# Patient Record
Sex: Female | Born: 1935 | Race: White | Hispanic: No | Smoking: Former smoker
Health system: Southern US, Community
[De-identification: ages and names within clinical notes are randomized; demographics above are authoritative.]

## PROBLEM LIST (undated history)

## (undated) DIAGNOSIS — I10 Essential (primary) hypertension: Secondary | ICD-10-CM

## (undated) DIAGNOSIS — H3530 Unspecified macular degeneration: Secondary | ICD-10-CM

## (undated) DIAGNOSIS — D649 Anemia, unspecified: Secondary | ICD-10-CM

## (undated) DIAGNOSIS — M1990 Unspecified osteoarthritis, unspecified site: Secondary | ICD-10-CM

## (undated) DIAGNOSIS — I509 Heart failure, unspecified: Secondary | ICD-10-CM

## (undated) HISTORY — DX: Anemia, unspecified: D64.9

## (undated) HISTORY — PX: TRIGGER FINGER RELEASE: SHX641

## (undated) HISTORY — PX: ABDOMINAL SURGERY: SHX537

## (undated) HISTORY — DX: Essential (primary) hypertension: I10

## (undated) HISTORY — PX: ABDOMINAL HYSTERECTOMY: SHX81

## (undated) HISTORY — PX: BLADDER SURGERY: SHX569

## (undated) HISTORY — PX: JOINT REPLACEMENT: SHX530

## (undated) HISTORY — PX: CARPAL TUNNEL RELEASE: SHX101

## (undated) HISTORY — PX: COLON SURGERY: SHX602

## (undated) HISTORY — DX: Heart failure, unspecified: I50.9

## (undated) HISTORY — PX: FRACTURE SURGERY: SHX138

---

## 2006-05-20 ENCOUNTER — Ambulatory Visit: Payer: Self-pay | Admitting: General Practice

## 2006-05-20 ENCOUNTER — Other Ambulatory Visit: Payer: Self-pay

## 2006-05-21 ENCOUNTER — Encounter: Payer: Self-pay | Admitting: Specialist

## 2007-01-21 ENCOUNTER — Ambulatory Visit: Payer: Self-pay | Admitting: Specialist

## 2008-01-22 ENCOUNTER — Inpatient Hospital Stay: Payer: Self-pay | Admitting: Orthopaedic Surgery

## 2008-03-02 ENCOUNTER — Other Ambulatory Visit: Payer: Self-pay

## 2008-03-02 ENCOUNTER — Ambulatory Visit: Payer: Self-pay | Admitting: General Practice

## 2008-03-02 ENCOUNTER — Ambulatory Visit: Payer: Self-pay | Admitting: Internal Medicine

## 2008-03-31 ENCOUNTER — Ambulatory Visit: Payer: Self-pay | Admitting: General Practice

## 2008-04-13 ENCOUNTER — Inpatient Hospital Stay: Payer: Self-pay | Admitting: General Practice

## 2008-04-17 ENCOUNTER — Encounter: Payer: Self-pay | Admitting: Internal Medicine

## 2008-07-06 ENCOUNTER — Ambulatory Visit: Payer: Self-pay | Admitting: General Practice

## 2008-07-15 ENCOUNTER — Ambulatory Visit: Payer: Self-pay | Admitting: General Practice

## 2008-07-21 ENCOUNTER — Inpatient Hospital Stay: Payer: Self-pay | Admitting: General Practice

## 2008-07-25 ENCOUNTER — Encounter: Payer: Self-pay | Admitting: Internal Medicine

## 2008-11-03 ENCOUNTER — Ambulatory Visit: Payer: Self-pay | Admitting: General Practice

## 2008-11-17 ENCOUNTER — Inpatient Hospital Stay: Payer: Self-pay | Admitting: General Practice

## 2008-11-21 ENCOUNTER — Encounter: Payer: Self-pay | Admitting: Internal Medicine

## 2010-05-09 ENCOUNTER — Inpatient Hospital Stay: Payer: Self-pay | Admitting: Internal Medicine

## 2011-06-02 ENCOUNTER — Ambulatory Visit (INDEPENDENT_AMBULATORY_CARE_PROVIDER_SITE_OTHER): Payer: Medicare Other | Admitting: Internal Medicine

## 2011-06-02 ENCOUNTER — Encounter: Payer: Self-pay | Admitting: Internal Medicine

## 2011-06-02 DIAGNOSIS — R5383 Other fatigue: Secondary | ICD-10-CM

## 2011-06-02 DIAGNOSIS — E538 Deficiency of other specified B group vitamins: Secondary | ICD-10-CM

## 2011-06-02 DIAGNOSIS — D649 Anemia, unspecified: Secondary | ICD-10-CM

## 2011-06-02 LAB — COMPREHENSIVE METABOLIC PANEL
BUN: 14 mg/dL (ref 6–23)
CO2: 24 mEq/L (ref 19–32)
Creatinine, Ser: 0.8 mg/dL (ref 0.4–1.2)
GFR: 74.06 mL/min (ref >60.00)
Glucose, Bld: 110 mg/dL — ABNORMAL HIGH (ref 70–99)
Total Bilirubin: 0.6 mg/dL (ref 0.3–1.2)
Total Protein: 7.4 g/dL (ref 6.0–8.3)

## 2011-06-02 LAB — VITAMIN B12: Vitamin B-12: 340 pg/mL (ref 211–911)

## 2011-06-02 LAB — CBC WITH DIFFERENTIAL/PLATELET
Basophils Absolute: 0 10*3/uL (ref 0.0–0.1)
Eosinophils Absolute: 0.1 10*3/uL (ref 0.0–0.7)
Lymphocytes Relative: 32 % (ref 12.0–46.0)
MCHC: 33.3 g/dL (ref 30.0–36.0)
MCV: 94.1 fl (ref 78.0–100.0)
Monocytes Absolute: 0.7 10*3/uL (ref 0.1–1.0)
Neutrophils Relative %: 55.2 % (ref 43.0–77.0)
Platelets: 224 10*3/uL (ref 150.0–400.0)
WBC: 6.5 10*3/uL (ref 4.5–10.5)

## 2011-06-02 LAB — FERRITIN: Ferritin: 32.5 ng/mL (ref 10.0–291.0)

## 2011-06-02 LAB — IRON AND TIBC
Iron: 94 ug/dL (ref 42–145)
UIBC: 276 ug/dL (ref 125–400)

## 2011-06-02 NOTE — Progress Notes (Signed)
Subjective:    Patient ID: Rebekah Ball, female    DOB: 09/04/1920, 77 y.o.   MRN: [id number]  HPI      Review of Systems     Objective:   Physical Exam        Assessment & Plan:   Subjective:    Rebekah Ball is a 77 y.o. female who presents for Medicare Annual/Subsequent preventive examination.  Preventive Screening-Counseling & Management  Tobacco History  Smoking status  . Former Smoker  Smokeless tobacco  . Not on file     Problems Prior to Visit 1.   Current Problems (verified) Patient Active Problem List  Diagnoses  . Anemia    Medications Prior to Visit No current outpatient prescriptions on file prior to visit.    Current Medications (verified) Current Outpatient Prescriptions  Medication Sig Dispense Refill  . aspirin 81 MG tablet Take 81 mg by mouth daily.        . fish oil-omega-3 fatty acids 1000 MG capsule Take 2 g by mouth daily.        Marland Kitchen glucosamine-chondroitin 500-400 MG tablet Take 1 tablet by mouth daily.        . naproxen sodium (ANAPROX) 220 MG tablet Take 220 mg by mouth daily.           Allergies (verified) Morphine and related   PAST HISTORY  Family History No family history on file.  Social History History  Substance Use Topics  . Smoking status: Former Games developer  . Smokeless tobacco: Not on file  . Alcohol Use: Yes     glass of wine q 3-4 months     Are there smokers in your home (other than you)? No  Risk Factors Current exercise habits: Home exercise routine includes stretching, walking 1 hrs per day and yoga.  Dietary issues discussed: none  Cardiac risk factors: advanced age (older than 42 for men, 69 for women).  Depression Screen (Note: if answer to either of the following is "Yes", a more complete depression screening is indicated)   Over the past two weeks, have you felt down, depressed or hopeless? No  Over the past two weeks, have you felt little interest or pleasure in doing things? No  Have you lost  interest or pleasure in daily life? No  Do you often feel hopeless? No  Do you cry easily over simple problems? No  Activities of Daily Living In your present state of health, do you have any difficulty performing the following activities?:  Driving? No Managing money?  No Feeding yourself? No Getting from bed to chair? No Climbing a flight of stairs? No Preparing food and eating?: No Bathing or showering? No Getting dressed: No Getting to the toilet? No Using the toilet:No Moving around from place to place: No In the past year have you fallen or had a near fall?:No   Are you sexually active?  No  Do you have more than one partner?  No  Hearing Difficulties: No Do you often ask people to speak up or repeat themselves? No Do you experience ringing or noises in your ears? No Do you have difficulty understanding soft or whispered voices? Yes   Do you feel that you have a problem with memory? No  Do you often misplace items? No  Do you feel safe at home?  Yes  Cognitive Testing  Alert? Yes  Normal Appearance?Yes  Oriented to person? Yes  Place? Yes   Time? No  Recall of  three objects?  Yes  Can perform simple calculations? Yes  Displays appropriate judgment?Yes  Can read the correct time from a watch face?Yes   Advanced Directives have been discussed with the patient? Yes  List the Names of Other Physician/Practitioners you currently use: 1.    Indicate any recent Medical Services you may have received from other than [facility] providers in the past year (date may be approximate).   There is no immunization history on file for this patient.  Screening Tests No health maintenance topics applied.  All answers were reviewed with the patient and necessary referrals were made:  Duncan Dull, MD   06/04/2011   History reviewed: allergies, current medications, past family history, past medical history, past social history, past surgical history and problem list  Review of  Systems A comprehensive review of systems was negative.    Objective:     Vision by Snellen chart: right ZOX:WRUEAVW declines measurement, left UJW:JXBJYNW declines measurement  Body mass index is 29.76 kg/(m^2). BP 142/80  Temp(Src) 97.8 F (36.6 C) (Oral)  Ht 5\' 3"  (1.6 m)  Wt 168 lb (76.204 kg)  BMI 29.76 kg/m2  BP 142/80  Temp(Src) 97.8 F (36.6 C) (Oral)  Ht 5\' 3"  (1.6 m)  Wt 168 lb (76.204 kg)  BMI 29.76 kg/m2 General appearance: alert, cooperative, appears stated age and moderately obese Eyes: conjunctivae/corneas clear. PERRL, EOM's intact. Fundi benign. Throat: lips, mucosa, and tongue normal; teeth and gums normal Neck: no adenopathy, no carotid bruit, no JVD, supple, symmetrical, trachea midline and thyroid not enlarged, symmetric, no tenderness/mass/nodules Lungs: clear to auscultation bilaterally Breasts: normal appearance, no masses or tenderness Abdomen: soft, non-tender; bowel sounds normal; no masses,  no organomegaly Extremities: extremities normal, atraumatic, no cyanosis or edema Pulses: 2+ and symmetric Lymph nodes: Cervical, supraclavicular, and axillary nodes normal. Neurologic: Grossly normal     Assessment:     Annual exam:  She is a healthy 77 yo female with no new issues identified  today.     Plan:     During the course of the visit the patient was educated and counseled about appropriate screening and preventive services including:    Advanced directives: has NO advanced directive  - add't info requested. Referral to SW: yes  Diet review for nutrition referral? Yes ____  Not Indicated ____   Patient Instructions (the written plan) was given to the patient.  Medicare Attestation I have personally reviewed: The patient's medical and social history Their use of alcohol, tobacco or illicit drugs Their current medications and supplements The patient's functional ability including ADLs,fall risks, home safety risks, cognitive, and  hearing and visual impairment Diet and physical activities Evidence for depression or mood disorders  The patient's weight, height, BMI, and visual acuity have been recorded in the chart.  I have made referrals, counseling, and provided education to the patient based on review of the above and I have provided the patient with a written personalized care plan for preventive services.     Duncan Dull, MD   06/04/2011

## 2011-06-02 NOTE — Patient Instructions (Signed)
  We are checking your thyroid, iron and b12 stores today to investigate your anemia.   Please get your TdaP (tetanus/ diphteria.whooping cough)  vaccine at [facility]

## 2011-06-04 ENCOUNTER — Encounter: Payer: Self-pay | Admitting: Internal Medicine

## 2011-06-04 DIAGNOSIS — D649 Anemia, unspecified: Secondary | ICD-10-CM | POA: Insufficient documentation

## 2011-09-07 DIAGNOSIS — M169 Osteoarthritis of hip, unspecified: Secondary | ICD-10-CM | POA: Diagnosis not present

## 2011-09-11 DIAGNOSIS — Z85828 Personal history of other malignant neoplasm of skin: Secondary | ICD-10-CM | POA: Diagnosis not present

## 2011-09-26 ENCOUNTER — Ambulatory Visit (INDEPENDENT_AMBULATORY_CARE_PROVIDER_SITE_OTHER): Payer: Medicare Other | Admitting: Internal Medicine

## 2011-09-26 ENCOUNTER — Encounter: Payer: Self-pay | Admitting: Internal Medicine

## 2011-09-26 DIAGNOSIS — M1990 Unspecified osteoarthritis, unspecified site: Secondary | ICD-10-CM | POA: Diagnosis not present

## 2011-09-26 DIAGNOSIS — I10 Essential (primary) hypertension: Secondary | ICD-10-CM

## 2011-09-26 MED ORDER — AMLODIPINE BESYLATE 5 MG PO TABS: 5.0000 mg | ORAL_TABLET | Freq: Every day | ORAL | Status: DC

## 2011-09-26 MED ORDER — TRAMADOL HCL 50 MG PO TABS: 50.0000 mg | ORAL_TABLET | Freq: Three times a day (TID) | ORAL | Status: AC | PRN

## 2011-09-26 NOTE — Progress Notes (Signed)
Subjective:    Rebekah Ball is a 78 y.o. female who presents to the office today for a preoperative consultation at the request of Dr Ernest Pine  who plans on performing total left hip replacement on  March 12. This consultation is requested for the specific conditions prompting preoperative evaluation (i.e. because of potential affect on operative risk): Marland Kitchen Planned anesthesia: general. The patient has the following known anesthesia issues: none. Patients bleeding risk: no recent abnormal bleeding. Patient does not have objections to receiving blood products if needed.  The following portions of the patient's history were reviewed and updated as appropriate: allergies, current medications, past family history, past medical history, past surgical history and problem list.  Review of Systems A comprehensive review of systems was negative.    Objective:    BP 150/72  Pulse 76  Temp(Src) 97.4 F (36.3 C) (Oral)  Wt 162 lb (73.483 kg)  SpO2 98%  General Appearance:    Alert, cooperative, no distress, appears stated age  Head:    Normocephalic, without obvious abnormality, atraumatic  Eyes:    PERRL, conjunctiva/corneas clear, EOM's intact, fundi    benign, both eyes  Ears:    Normal TM's and external ear canals, both ears  Nose:   Nares normal, septum midline, mucosa normal, no drainage    or sinus tenderness  Throat:   Lips, mucosa, and tongue normal; teeth and gums normal  Neck:   Supple, symmetrical, trachea midline, no adenopathy;    thyroid:  no enlargement/tenderness/nodules; no carotid   bruit or JVD  Back:     Symmetric, no curvature, ROM normal, no CVA tenderness  Lungs:     Clear to auscultation bilaterally, respirations unlabored  Chest Wall:    No tenderness or deformity   Heart:    Regular rate and rhythm, S1 and S2 normal, no murmur, rub   or gallop  Breast Exam:    No tenderness, masses, or nipple abnormality  Abdomen:     Soft, non-tender, bowel sounds active all four  quadrants,    no masses, no organomegaly  Genitalia:    Normal female without lesion, discharge or tenderness  Rectal:    Normal tone, normal prostate, no masses or tenderness;   guaiac negative stool  Extremities:   Extremities normal, atraumatic, no cyanosis or edema  Pulses:   2+ and symmetric all extremities  Skin:   Skin color, texture, turgor normal, no rashes or lesions  Lymph nodes:   Cervical, supraclavicular, and axillary nodes normal  Neurologic:   CNII-XII intact, normal strength, sensation and reflexes    throughout    Predictors of intubation difficulty:  Morbid obesity? no  Anatomically abnormal facies? no  Prominent incisors? no  Receding mandible? no  Short, thick neck? no  Neck range of motion: normal  No chipped, loose, or missing teeth.  Cardiographics ECG: normal sinus rhythm, no blocks or conduction defects, no ischemic changes Echocardiogram: not done  Imaging Chest x-ray: to be ordered by [facility]   Lab Review  No visits with results within 2 Month(s) from this visit. Latest known visit with results is:  Office Visit on 06/02/2011  Component Date Value  . TSH 06/02/2011 2.69   . WBC 06/02/2011 6.5   . RBC 06/02/2011 4.56   . Hemoglobin 06/02/2011 14.3   . HCT 06/02/2011 42.9   . MCV 06/02/2011 94.1   . MCHC 06/02/2011 33.3   . RDW 06/02/2011 13.8   . Platelets 06/02/2011 224.0   .  Neutrophils Relative 06/02/2011 55.2   . Lymphocytes Relative 06/02/2011 32.0   . Monocytes Relative 06/02/2011 10.4   . Eosinophils Relative 06/02/2011 1.8   . Basophils Relative 06/02/2011 0.6   . Neutro Abs 06/02/2011 3.6   . Lymphs Abs 06/02/2011 2.1   . Monocytes Absolute 06/02/2011 0.7   . Eosinophils Absolute 06/02/2011 0.1   . Basophils Absolute 06/02/2011 0.0   . Ferritin 06/02/2011 32.5   . Iron 06/02/2011 94   . UIBC 06/02/2011 276   . TIBC 06/02/2011 370   . %SAT 06/02/2011 25   . Sodium 06/02/2011 137   . Potassium 06/02/2011 4.7   . Chloride  06/02/2011 104   . CO2 06/02/2011 24   . Glucose, Bld 06/02/2011 110*  . BUN 06/02/2011 14   . Creatinine, Ser 06/02/2011 0.8   . Total Bilirubin 06/02/2011 0.6   . Alkaline Phosphatase 06/02/2011 60   . AST 06/02/2011 18   . ALT 06/02/2011 11   . Total Protein 06/02/2011 7.4   . Albumin 06/02/2011 4.1   . Calcium 06/02/2011 9.0   . GFR 06/02/2011 74.06   . Vitamin B-12 06/02/2011 340       Assessment:      78 y.o. female with planned surgery as above.   Known risk factors for perioperative complications: None   Difficulty with intubation is not anticipated. Hypertension: new onset.  Cardiac Risk Estimation: low  Current medications which may produce withdrawal symptoms if withheld perioperatively: none    Plan:    1. Preoperative workup as follows chest x-ray. 2. Change in medication regimen before surgery: discontinue ASA 14 days before surgery and discontinue NSAIDs () 14 days before surgery. 3. Prophylaxis for cardiac events with perioperative beta-blockers: not indicated. 4. Invasive hemodynamic monitoring perioperatively: not indicated. 5. Deep vein thrombosis prophylaxis postoperatively:regimen to be chosen by surgical team. 6. Surveillance for postoperative MI with ECG immediately postoperatively and on postoperative days 1 and 2 AND troponin levels 24 hours postoperatively and on day 4 or hospital discharge (whichever comes first): not indicated. 7. Other measures: amlodipine 5 m,g daily, started today for hypertension.

## 2011-09-26 NOTE — Patient Instructions (Addendum)
I am starting you on a medication called amlodipine for your blood pressure .  Take it one time daily every day.  Have the nurse repeat your blood pressure in one week.  I am also giving you a new pain medication to replace your daily  Alleve.  It is called tramadol and can be taken every 6 to 8 hours as needed for pain   I recommend a trial of lactaid instead of regular milk to see if your flatulence and loose stools is from lactose intolerance

## 2011-09-28 ENCOUNTER — Encounter: Payer: Self-pay | Admitting: Internal Medicine

## 2011-09-28 DIAGNOSIS — I10 Essential (primary) hypertension: Secondary | ICD-10-CM | POA: Insufficient documentation

## 2011-09-28 NOTE — Assessment & Plan Note (Signed)
New onset, may be due to naproxen  Amlodipine 5 mg daily started.

## 2011-10-05 ENCOUNTER — Telehealth: Payer: Self-pay | Admitting: Internal Medicine

## 2011-10-05 ENCOUNTER — Other Ambulatory Visit: Payer: Self-pay | Admitting: Internal Medicine

## 2011-10-05 DIAGNOSIS — I10 Essential (primary) hypertension: Secondary | ICD-10-CM

## 2011-10-05 MED ORDER — AMLODIPINE BESYLATE 10 MG PO TABS: 10.0000 mg | ORAL_TABLET | Freq: Every day | ORAL | Status: DC

## 2011-10-05 NOTE — Telephone Encounter (Signed)
please tell her to increase the amlodipine to 10 mg daily for her blood pressure  Once daily .  iw ill sen dnew rx to pharmacy

## 2011-10-05 NOTE — Telephone Encounter (Signed)
Of course she has no pharmacy in her chart,  Can you call it in?  thanks

## 2011-10-06 NOTE — Telephone Encounter (Signed)
Medicine called to [facility]. 

## 2011-10-12 ENCOUNTER — Ambulatory Visit: Payer: Self-pay | Admitting: General Practice

## 2011-10-12 DIAGNOSIS — Z8719 Personal history of other diseases of the digestive system: Secondary | ICD-10-CM | POA: Diagnosis not present

## 2011-10-12 DIAGNOSIS — Z79899 Other long term (current) drug therapy: Secondary | ICD-10-CM | POA: Diagnosis not present

## 2011-10-12 DIAGNOSIS — M1990 Unspecified osteoarthritis, unspecified site: Secondary | ICD-10-CM | POA: Diagnosis not present

## 2011-10-12 DIAGNOSIS — Z01812 Encounter for preprocedural laboratory examination: Secondary | ICD-10-CM | POA: Diagnosis not present

## 2011-10-12 DIAGNOSIS — Z87891 Personal history of nicotine dependence: Secondary | ICD-10-CM | POA: Diagnosis not present

## 2011-10-12 DIAGNOSIS — M169 Osteoarthritis of hip, unspecified: Secondary | ICD-10-CM | POA: Diagnosis not present

## 2011-10-12 DIAGNOSIS — Z885 Allergy status to narcotic agent status: Secondary | ICD-10-CM | POA: Diagnosis not present

## 2011-10-12 DIAGNOSIS — Z9109 Other allergy status, other than to drugs and biological substances: Secondary | ICD-10-CM | POA: Diagnosis not present

## 2011-10-12 LAB — BASIC METABOLIC PANEL
BUN: 15 mg/dL (ref 7–18)
Calcium, Total: 9.2 mg/dL (ref 8.5–10.1)
Co2: 25 mmol/L (ref 21–32)
Creatinine: 0.82 mg/dL (ref 0.60–1.30)
EGFR (African American): >60
EGFR (Non-African Amer.): >60
Glucose: 74 mg/dL (ref 65–99)
Potassium: 4.2 mmol/L (ref 3.5–5.1)
Sodium: 140 mmol/L (ref 136–145)

## 2011-10-12 LAB — CBC
MCH: 30.9 pg (ref 26.0–34.0)
MCV: 94 fL (ref 80–100)
Platelet: 250 10*3/uL (ref 150–440)
RBC: 4.5 10*6/uL (ref 3.80–5.20)
RDW: 13.6 % (ref 11.5–14.5)

## 2011-10-12 LAB — URINALYSIS, COMPLETE
Bacteria: NONE SEEN
Bilirubin,UR: NEGATIVE
Blood: NEGATIVE
Ketone: NEGATIVE
Ph: 5 (ref 4.5–8.0)
Protein: NEGATIVE
Specific Gravity: 1.013 (ref 1.003–1.030)
Squamous Epithelial: 1

## 2011-10-12 LAB — APTT: Activated PTT: 34.2 secs (ref 23.6–35.9)

## 2011-10-12 LAB — MRSA PCR SCREENING

## 2011-10-12 LAB — SEDIMENTATION RATE: Erythrocyte Sed Rate: 12 mm/hr (ref 0–30)

## 2011-10-12 LAB — PROTIME-INR: Prothrombin Time: 12.1 secs (ref 11.5–14.7)

## 2011-10-25 ENCOUNTER — Inpatient Hospital Stay: Payer: Self-pay | Admitting: General Practice

## 2011-10-25 DIAGNOSIS — Z9109 Other allergy status, other than to drugs and biological substances: Secondary | ICD-10-CM | POA: Diagnosis not present

## 2011-10-25 DIAGNOSIS — Z8719 Personal history of other diseases of the digestive system: Secondary | ICD-10-CM | POA: Diagnosis not present

## 2011-10-25 DIAGNOSIS — K5900 Constipation, unspecified: Secondary | ICD-10-CM | POA: Diagnosis not present

## 2011-10-25 DIAGNOSIS — M6281 Muscle weakness (generalized): Secondary | ICD-10-CM | POA: Diagnosis not present

## 2011-10-25 DIAGNOSIS — Z5189 Encounter for other specified aftercare: Secondary | ICD-10-CM | POA: Diagnosis not present

## 2011-10-25 DIAGNOSIS — Z471 Aftercare following joint replacement surgery: Secondary | ICD-10-CM | POA: Diagnosis not present

## 2011-10-25 DIAGNOSIS — Z96659 Presence of unspecified artificial knee joint: Secondary | ICD-10-CM | POA: Diagnosis not present

## 2011-10-25 DIAGNOSIS — M1990 Unspecified osteoarthritis, unspecified site: Secondary | ICD-10-CM | POA: Diagnosis not present

## 2011-10-25 DIAGNOSIS — M169 Osteoarthritis of hip, unspecified: Secondary | ICD-10-CM | POA: Diagnosis not present

## 2011-10-25 DIAGNOSIS — E785 Hyperlipidemia, unspecified: Secondary | ICD-10-CM | POA: Diagnosis not present

## 2011-10-25 DIAGNOSIS — R6889 Other general symptoms and signs: Secondary | ICD-10-CM | POA: Diagnosis not present

## 2011-10-25 DIAGNOSIS — R269 Unspecified abnormalities of gait and mobility: Secondary | ICD-10-CM | POA: Diagnosis not present

## 2011-10-25 DIAGNOSIS — Z885 Allergy status to narcotic agent status: Secondary | ICD-10-CM | POA: Diagnosis not present

## 2011-10-25 DIAGNOSIS — Z96649 Presence of unspecified artificial hip joint: Secondary | ICD-10-CM | POA: Diagnosis not present

## 2011-10-25 LAB — PLATELET COUNT: Platelet: 200 10*3/uL (ref 150–440)

## 2011-10-25 LAB — CREATININE, SERUM: Creatinine: 0.79 mg/dL (ref 0.60–1.30)

## 2011-10-26 LAB — BASIC METABOLIC PANEL
BUN: 11 mg/dL (ref 7–18)
EGFR (Non-African Amer.): >60
Glucose: 90 mg/dL (ref 65–99)
Potassium: 3.9 mmol/L (ref 3.5–5.1)
Sodium: 138 mmol/L (ref 136–145)

## 2011-10-27 LAB — BASIC METABOLIC PANEL
Anion Gap: 10 (ref 7–16)
Chloride: 106 mmol/L (ref 98–107)
Co2: 23 mmol/L (ref 21–32)
EGFR (African American): >60
EGFR (Non-African Amer.): >60
Glucose: 97 mg/dL (ref 65–99)
Osmolality: 276 (ref 275–301)

## 2011-10-27 LAB — HEMOGLOBIN: HGB: 9.8 g/dL — ABNORMAL LOW (ref 12.0–16.0)

## 2011-10-28 DIAGNOSIS — Z96649 Presence of unspecified artificial hip joint: Secondary | ICD-10-CM | POA: Diagnosis not present

## 2011-10-28 DIAGNOSIS — M6281 Muscle weakness (generalized): Secondary | ICD-10-CM | POA: Diagnosis not present

## 2011-10-28 DIAGNOSIS — R269 Unspecified abnormalities of gait and mobility: Secondary | ICD-10-CM | POA: Diagnosis not present

## 2011-10-28 DIAGNOSIS — R05 Cough: Secondary | ICD-10-CM | POA: Diagnosis not present

## 2011-10-28 DIAGNOSIS — I10 Essential (primary) hypertension: Secondary | ICD-10-CM | POA: Diagnosis not present

## 2011-10-28 DIAGNOSIS — R6889 Other general symptoms and signs: Secondary | ICD-10-CM | POA: Diagnosis not present

## 2011-10-28 DIAGNOSIS — E785 Hyperlipidemia, unspecified: Secondary | ICD-10-CM | POA: Diagnosis not present

## 2011-10-28 DIAGNOSIS — Z5189 Encounter for other specified aftercare: Secondary | ICD-10-CM | POA: Diagnosis not present

## 2011-10-28 DIAGNOSIS — K5900 Constipation, unspecified: Secondary | ICD-10-CM | POA: Diagnosis not present

## 2011-10-28 DIAGNOSIS — Z471 Aftercare following joint replacement surgery: Secondary | ICD-10-CM | POA: Diagnosis not present

## 2011-10-28 DIAGNOSIS — M1991 Primary osteoarthritis, unspecified site: Secondary | ICD-10-CM | POA: Diagnosis not present

## 2011-10-29 ENCOUNTER — Encounter: Payer: Self-pay | Admitting: Internal Medicine

## 2011-10-30 DIAGNOSIS — M1991 Primary osteoarthritis, unspecified site: Secondary | ICD-10-CM | POA: Diagnosis not present

## 2011-10-30 DIAGNOSIS — I10 Essential (primary) hypertension: Secondary | ICD-10-CM | POA: Diagnosis not present

## 2011-11-08 DIAGNOSIS — R05 Cough: Secondary | ICD-10-CM | POA: Diagnosis not present

## 2011-11-13 ENCOUNTER — Encounter: Payer: Self-pay | Admitting: Internal Medicine

## 2011-11-20 DIAGNOSIS — Z96659 Presence of unspecified artificial knee joint: Secondary | ICD-10-CM | POA: Diagnosis not present

## 2011-11-20 DIAGNOSIS — M6281 Muscle weakness (generalized): Secondary | ICD-10-CM | POA: Diagnosis not present

## 2011-11-27 DIAGNOSIS — Z85828 Personal history of other malignant neoplasm of skin: Secondary | ICD-10-CM | POA: Diagnosis not present

## 2011-12-07 DIAGNOSIS — I10 Essential (primary) hypertension: Secondary | ICD-10-CM | POA: Diagnosis not present

## 2011-12-07 DIAGNOSIS — Z96649 Presence of unspecified artificial hip joint: Secondary | ICD-10-CM | POA: Diagnosis not present

## 2011-12-13 ENCOUNTER — Encounter: Payer: Self-pay | Admitting: Internal Medicine

## 2012-07-29 DIAGNOSIS — C44211 Basal cell carcinoma of skin of unspecified ear and external auricular canal: Secondary | ICD-10-CM | POA: Diagnosis not present

## 2012-07-29 DIAGNOSIS — Z85828 Personal history of other malignant neoplasm of skin: Secondary | ICD-10-CM | POA: Diagnosis not present

## 2012-07-29 DIAGNOSIS — D485 Neoplasm of uncertain behavior of skin: Secondary | ICD-10-CM | POA: Diagnosis not present

## 2012-07-29 DIAGNOSIS — L570 Actinic keratosis: Secondary | ICD-10-CM | POA: Diagnosis not present

## 2012-08-16 DIAGNOSIS — C4441 Basal cell carcinoma of skin of scalp and neck: Secondary | ICD-10-CM | POA: Diagnosis not present

## 2012-11-18 DIAGNOSIS — L820 Inflamed seborrheic keratosis: Secondary | ICD-10-CM | POA: Diagnosis not present

## 2012-11-18 DIAGNOSIS — Z85828 Personal history of other malignant neoplasm of skin: Secondary | ICD-10-CM | POA: Diagnosis not present

## 2012-11-18 DIAGNOSIS — D235 Other benign neoplasm of skin of trunk: Secondary | ICD-10-CM | POA: Diagnosis not present

## 2013-05-14 DIAGNOSIS — Z23 Encounter for immunization: Secondary | ICD-10-CM | POA: Diagnosis not present

## 2013-09-29 DIAGNOSIS — C4441 Basal cell carcinoma of skin of scalp and neck: Secondary | ICD-10-CM | POA: Diagnosis not present

## 2014-01-10 DIAGNOSIS — E860 Dehydration: Secondary | ICD-10-CM | POA: Diagnosis not present

## 2014-01-10 DIAGNOSIS — Z791 Long term (current) use of non-steroidal anti-inflammatories (NSAID): Secondary | ICD-10-CM | POA: Diagnosis not present

## 2014-01-10 DIAGNOSIS — R197 Diarrhea, unspecified: Secondary | ICD-10-CM | POA: Diagnosis not present

## 2014-01-10 DIAGNOSIS — I10 Essential (primary) hypertension: Secondary | ICD-10-CM | POA: Diagnosis not present

## 2014-01-10 DIAGNOSIS — K922 Gastrointestinal hemorrhage, unspecified: Secondary | ICD-10-CM | POA: Diagnosis not present

## 2014-01-10 DIAGNOSIS — K625 Hemorrhage of anus and rectum: Secondary | ICD-10-CM | POA: Diagnosis not present

## 2014-01-10 DIAGNOSIS — N390 Urinary tract infection, site not specified: Secondary | ICD-10-CM | POA: Diagnosis not present

## 2014-01-10 DIAGNOSIS — R110 Nausea: Secondary | ICD-10-CM | POA: Diagnosis not present

## 2014-01-10 DIAGNOSIS — K5730 Diverticulosis of large intestine without perforation or abscess without bleeding: Secondary | ICD-10-CM | POA: Diagnosis not present

## 2014-01-10 DIAGNOSIS — N289 Disorder of kidney and ureter, unspecified: Secondary | ICD-10-CM | POA: Diagnosis not present

## 2014-01-10 DIAGNOSIS — K921 Melena: Secondary | ICD-10-CM | POA: Diagnosis not present

## 2014-01-10 DIAGNOSIS — Z7982 Long term (current) use of aspirin: Secondary | ICD-10-CM | POA: Diagnosis not present

## 2014-01-10 LAB — HEMOGLOBIN: HGB: 11.7 g/dL — AB (ref 12.0–16.0)

## 2014-01-10 LAB — COMPREHENSIVE METABOLIC PANEL
ALBUMIN: 3.3 g/dL — AB (ref 3.4–5.0)
ALT: 12 U/L (ref 12–78)
ANION GAP: 5 — AB (ref 7–16)
Alkaline Phosphatase: 55 U/L
BUN: 21 mg/dL — ABNORMAL HIGH (ref 7–18)
Bilirubin,Total: 0.5 mg/dL (ref 0.2–1.0)
CO2: 23 mmol/L (ref 21–32)
CREATININE: 0.97 mg/dL (ref 0.60–1.30)
Calcium, Total: 8.7 mg/dL (ref 8.5–10.1)
Chloride: 111 mmol/L — ABNORMAL HIGH (ref 98–107)
EGFR (African American): 60 — ABNORMAL LOW
GFR CALC NON AF AMER: 51 — AB
Glucose: 127 mg/dL — ABNORMAL HIGH (ref 65–99)
Osmolality: 282 (ref 275–301)
Potassium: 4.4 mmol/L (ref 3.5–5.1)
SGOT(AST): 27 U/L (ref 15–37)
Sodium: 139 mmol/L (ref 136–145)
TOTAL PROTEIN: 6.9 g/dL (ref 6.4–8.2)

## 2014-01-10 LAB — URINALYSIS, COMPLETE
BILIRUBIN, UR: NEGATIVE
GLUCOSE, UR: NEGATIVE mg/dL (ref 0–75)
Hyaline Cast: 7
Nitrite: NEGATIVE
PH: 5 (ref 4.5–8.0)
Protein: NEGATIVE
RBC,UR: 5 /HPF (ref 0–5)
Specific Gravity: 1.021 (ref 1.003–1.030)
Squamous Epithelial: 3
WBC UR: 2 /HPF (ref 0–5)

## 2014-01-10 LAB — CBC
HCT: 40.8 % (ref 35.0–47.0)
HGB: 13.2 g/dL (ref 12.0–16.0)
MCH: 30.4 pg (ref 26.0–34.0)
MCHC: 32.3 g/dL (ref 32.0–36.0)
MCV: 94 fL (ref 80–100)
PLATELETS: 230 10*3/uL (ref 150–440)
RBC: 4.33 10*6/uL (ref 3.80–5.20)
RDW: 14.1 % (ref 11.5–14.5)
WBC: 8.4 10*3/uL (ref 3.6–11.0)

## 2014-01-10 LAB — PROTIME-INR
INR: 1
Prothrombin Time: 13.1 secs (ref 11.5–14.7)

## 2014-01-10 LAB — OCCULT BLOOD X 1 CARD TO LAB, STOOL: OCCULT BLOOD, FECES: POSITIVE

## 2014-01-11 DIAGNOSIS — E860 Dehydration: Secondary | ICD-10-CM | POA: Diagnosis not present

## 2014-01-11 DIAGNOSIS — Z791 Long term (current) use of non-steroidal anti-inflammatories (NSAID): Secondary | ICD-10-CM | POA: Diagnosis not present

## 2014-01-11 DIAGNOSIS — K922 Gastrointestinal hemorrhage, unspecified: Secondary | ICD-10-CM | POA: Diagnosis not present

## 2014-01-11 DIAGNOSIS — K625 Hemorrhage of anus and rectum: Secondary | ICD-10-CM | POA: Diagnosis not present

## 2014-01-11 DIAGNOSIS — D62 Acute posthemorrhagic anemia: Secondary | ICD-10-CM | POA: Diagnosis not present

## 2014-01-11 DIAGNOSIS — K921 Melena: Secondary | ICD-10-CM | POA: Diagnosis not present

## 2014-01-11 DIAGNOSIS — K5730 Diverticulosis of large intestine without perforation or abscess without bleeding: Secondary | ICD-10-CM | POA: Diagnosis not present

## 2014-01-11 DIAGNOSIS — R110 Nausea: Secondary | ICD-10-CM | POA: Diagnosis not present

## 2014-01-11 DIAGNOSIS — Z7982 Long term (current) use of aspirin: Secondary | ICD-10-CM | POA: Diagnosis not present

## 2014-01-11 DIAGNOSIS — I10 Essential (primary) hypertension: Secondary | ICD-10-CM | POA: Diagnosis not present

## 2014-01-11 LAB — BASIC METABOLIC PANEL
ANION GAP: 5 — AB (ref 7–16)
BUN: 18 mg/dL (ref 7–18)
Calcium, Total: 8 mg/dL — ABNORMAL LOW (ref 8.5–10.1)
Chloride: 114 mmol/L — ABNORMAL HIGH (ref 98–107)
Co2: 24 mmol/L (ref 21–32)
Creatinine: 0.87 mg/dL (ref 0.60–1.30)
EGFR (African American): >60
GFR CALC NON AF AMER: 59 — AB
Glucose: 92 mg/dL (ref 65–99)
Osmolality: 287 (ref 275–301)
Potassium: 3.8 mmol/L (ref 3.5–5.1)
SODIUM: 143 mmol/L (ref 136–145)

## 2014-01-11 LAB — CBC WITH DIFFERENTIAL/PLATELET
BASOS ABS: 0 10*3/uL (ref 0.0–0.1)
BASOS PCT: 0.4 %
EOS ABS: 0.1 10*3/uL (ref 0.0–0.7)
Eosinophil %: 0.9 %
HCT: 31 % — ABNORMAL LOW (ref 35.0–47.0)
HGB: 10.1 g/dL — ABNORMAL LOW (ref 12.0–16.0)
Lymphocyte #: 2 10*3/uL (ref 1.0–3.6)
Lymphocyte %: 26.8 %
MCH: 30.4 pg (ref 26.0–34.0)
MCHC: 32.5 g/dL (ref 32.0–36.0)
MCV: 94 fL (ref 80–100)
Monocyte #: 1 x10 3/mm — ABNORMAL HIGH (ref 0.2–0.9)
Monocyte %: 13.1 %
Neutrophil #: 4.4 10*3/uL (ref 1.4–6.5)
Neutrophil %: 58.8 %
PLATELETS: 194 10*3/uL (ref 150–440)
RBC: 3.32 10*6/uL — ABNORMAL LOW (ref 3.80–5.20)
RDW: 14 % (ref 11.5–14.5)
WBC: 7.5 10*3/uL (ref 3.6–11.0)

## 2014-01-11 LAB — HEMOGLOBIN: HGB: 10.2 g/dL — ABNORMAL LOW (ref 12.0–16.0)

## 2014-01-12 DIAGNOSIS — D62 Acute posthemorrhagic anemia: Secondary | ICD-10-CM | POA: Diagnosis not present

## 2014-01-12 DIAGNOSIS — Z791 Long term (current) use of non-steroidal anti-inflammatories (NSAID): Secondary | ICD-10-CM | POA: Diagnosis not present

## 2014-01-12 DIAGNOSIS — K921 Melena: Secondary | ICD-10-CM | POA: Diagnosis not present

## 2014-01-12 DIAGNOSIS — R110 Nausea: Secondary | ICD-10-CM | POA: Diagnosis not present

## 2014-01-12 DIAGNOSIS — Z7982 Long term (current) use of aspirin: Secondary | ICD-10-CM | POA: Diagnosis not present

## 2014-01-12 DIAGNOSIS — I10 Essential (primary) hypertension: Secondary | ICD-10-CM | POA: Diagnosis not present

## 2014-01-12 DIAGNOSIS — K5730 Diverticulosis of large intestine without perforation or abscess without bleeding: Secondary | ICD-10-CM | POA: Diagnosis not present

## 2014-01-12 DIAGNOSIS — K922 Gastrointestinal hemorrhage, unspecified: Secondary | ICD-10-CM | POA: Diagnosis not present

## 2014-01-12 DIAGNOSIS — E860 Dehydration: Secondary | ICD-10-CM | POA: Diagnosis not present

## 2014-01-12 LAB — HEMOGLOBIN: HGB: 10.2 g/dL — ABNORMAL LOW (ref 12.0–16.0)

## 2014-01-13 ENCOUNTER — Inpatient Hospital Stay: Payer: Self-pay | Admitting: Internal Medicine

## 2014-01-13 DIAGNOSIS — E860 Dehydration: Secondary | ICD-10-CM | POA: Diagnosis present

## 2014-01-13 DIAGNOSIS — R7309 Other abnormal glucose: Secondary | ICD-10-CM | POA: Diagnosis present

## 2014-01-13 DIAGNOSIS — K921 Melena: Secondary | ICD-10-CM | POA: Diagnosis not present

## 2014-01-13 DIAGNOSIS — I10 Essential (primary) hypertension: Secondary | ICD-10-CM | POA: Diagnosis present

## 2014-01-13 DIAGNOSIS — N289 Disorder of kidney and ureter, unspecified: Secondary | ICD-10-CM | POA: Diagnosis present

## 2014-01-13 DIAGNOSIS — K5731 Diverticulosis of large intestine without perforation or abscess with bleeding: Secondary | ICD-10-CM | POA: Diagnosis present

## 2014-01-13 DIAGNOSIS — Z885 Allergy status to narcotic agent status: Secondary | ICD-10-CM | POA: Diagnosis not present

## 2014-01-13 DIAGNOSIS — K5730 Diverticulosis of large intestine without perforation or abscess without bleeding: Secondary | ICD-10-CM | POA: Diagnosis not present

## 2014-01-13 DIAGNOSIS — K922 Gastrointestinal hemorrhage, unspecified: Secondary | ICD-10-CM | POA: Diagnosis not present

## 2014-01-13 DIAGNOSIS — Z87891 Personal history of nicotine dependence: Secondary | ICD-10-CM | POA: Diagnosis not present

## 2014-01-13 DIAGNOSIS — K648 Other hemorrhoids: Secondary | ICD-10-CM | POA: Diagnosis present

## 2014-01-13 DIAGNOSIS — M1990 Unspecified osteoarthritis, unspecified site: Secondary | ICD-10-CM | POA: Diagnosis present

## 2014-01-13 DIAGNOSIS — D62 Acute posthemorrhagic anemia: Secondary | ICD-10-CM | POA: Diagnosis present

## 2014-01-13 DIAGNOSIS — Z7982 Long term (current) use of aspirin: Secondary | ICD-10-CM | POA: Diagnosis not present

## 2014-01-13 DIAGNOSIS — F411 Generalized anxiety disorder: Secondary | ICD-10-CM | POA: Diagnosis present

## 2014-01-13 DIAGNOSIS — H9190 Unspecified hearing loss, unspecified ear: Secondary | ICD-10-CM | POA: Diagnosis present

## 2014-01-13 DIAGNOSIS — Z791 Long term (current) use of non-steroidal anti-inflammatories (NSAID): Secondary | ICD-10-CM | POA: Diagnosis not present

## 2014-01-13 LAB — HEMOGLOBIN: HGB: 10.2 g/dL — ABNORMAL LOW (ref 12.0–16.0)

## 2014-01-14 ENCOUNTER — Telehealth: Payer: Self-pay | Admitting: Internal Medicine

## 2014-01-14 DIAGNOSIS — I10 Essential (primary) hypertension: Secondary | ICD-10-CM | POA: Diagnosis not present

## 2014-01-14 DIAGNOSIS — K922 Gastrointestinal hemorrhage, unspecified: Secondary | ICD-10-CM | POA: Diagnosis not present

## 2014-01-14 DIAGNOSIS — D62 Acute posthemorrhagic anemia: Secondary | ICD-10-CM | POA: Diagnosis not present

## 2014-01-14 DIAGNOSIS — E860 Dehydration: Secondary | ICD-10-CM | POA: Diagnosis not present

## 2014-01-14 LAB — HEMOGLOBIN: HGB: 10.2 g/dL — ABNORMAL LOW (ref 12.0–16.0)

## 2014-01-14 NOTE — Telephone Encounter (Signed)
12:00 ON Monday,.  I cannot clarify the medications without the hospital discharge summary

## 2014-01-14 NOTE — Telephone Encounter (Signed)
Patient just came home , and needs clarification on medications Patient care for [facility] sending over medication list, also patient needs follow up but no appointment available until 01/20/14 and only an acute spot open. Patient hospitalized for GI bleed and Hypertension.  Marfa would like patient seen within 3 days of discharge and patient prefers to see only you.  For my information call Ival Bible @ [phone] or Augustine Radar @ [phone]

## 2014-01-14 NOTE — Telephone Encounter (Signed)
Shelia from [facility] called states patient has been in the hospital since January 13, 2014 and is being released today. She stated patient needed to be seen within 2-3 days. Please advise where you would like me to put patient on the schedule. They said she was admitted for Hypertension and GI bleed we need to call patient with the appointment.

## 2014-01-15 ENCOUNTER — Telehealth: Payer: Self-pay | Admitting: Internal Medicine

## 2014-01-15 NOTE — Telephone Encounter (Signed)
Needing a hospital follow appointment with in 2-3 days. The patient was seen at [facility] for  GI bleed.

## 2014-01-15 NOTE — Telephone Encounter (Signed)
I tried to schedule this time I cannot blocked. Patient is aware.

## 2014-01-15 NOTE — Telephone Encounter (Signed)
Scheduled for Monday at 12.

## 2014-01-16 NOTE — Telephone Encounter (Signed)
done

## 2014-01-19 ENCOUNTER — Encounter: Payer: Self-pay | Admitting: Internal Medicine

## 2014-01-19 ENCOUNTER — Ambulatory Visit (INDEPENDENT_AMBULATORY_CARE_PROVIDER_SITE_OTHER): Payer: Medicare Other | Admitting: Internal Medicine

## 2014-01-19 VITALS — BP 148/66 | HR 70 | Temp 97.7°F | Resp 14 | Ht 63.0 in | Wt 158.2 lb

## 2014-01-19 DIAGNOSIS — D62 Acute posthemorrhagic anemia: Secondary | ICD-10-CM | POA: Diagnosis not present

## 2014-01-19 DIAGNOSIS — D649 Anemia, unspecified: Secondary | ICD-10-CM | POA: Diagnosis not present

## 2014-01-19 DIAGNOSIS — I10 Essential (primary) hypertension: Secondary | ICD-10-CM | POA: Diagnosis not present

## 2014-01-19 LAB — CBC WITH DIFFERENTIAL/PLATELET
Basophils Absolute: 0 10*3/uL (ref 0.0–0.1)
Basophils Relative: 0.6 % (ref 0.0–3.0)
EOS ABS: 0.1 10*3/uL (ref 0.0–0.7)
Eosinophils Relative: 1.2 % (ref 0.0–5.0)
HCT: 30.5 % — ABNORMAL LOW (ref 36.0–46.0)
HEMOGLOBIN: 10 g/dL — AB (ref 12.0–15.0)
LYMPHS ABS: 2.3 10*3/uL (ref 0.7–4.0)
Lymphocytes Relative: 29.1 % (ref 12.0–46.0)
MCHC: 32.8 g/dL (ref 30.0–36.0)
MCV: 93.9 fl (ref 78.0–100.0)
Monocytes Absolute: 0.9 10*3/uL (ref 0.1–1.0)
Monocytes Relative: 10.9 % (ref 3.0–12.0)
NEUTROS ABS: 4.6 10*3/uL (ref 1.4–7.7)
Neutrophils Relative %: 58.2 % (ref 43.0–77.0)
Platelets: 268 10*3/uL (ref 150.0–400.0)
RBC: 3.25 Mil/uL — ABNORMAL LOW (ref 3.87–5.11)
RDW: 14.7 % (ref 11.5–15.5)
WBC: 7.9 10*3/uL (ref 4.0–10.5)

## 2014-01-19 LAB — COMPREHENSIVE METABOLIC PANEL
ALT: 17 U/L (ref 0–35)
AST: 17 U/L (ref 0–37)
Albumin: 3.5 g/dL (ref 3.5–5.2)
Alkaline Phosphatase: 51 U/L (ref 39–117)
BILIRUBIN TOTAL: 0.4 mg/dL (ref 0.2–1.2)
BUN: 13 mg/dL (ref 6–23)
CALCIUM: 9.1 mg/dL (ref 8.4–10.5)
CHLORIDE: 105 meq/L (ref 96–112)
CO2: 27 meq/L (ref 19–32)
CREATININE: 0.9 mg/dL (ref 0.4–1.2)
GFR: 61.62 mL/min (ref >60.00)
GLUCOSE: 88 mg/dL (ref 70–99)
Potassium: 4.8 mEq/L (ref 3.5–5.1)
Sodium: 138 mEq/L (ref 135–145)
Total Protein: 6.3 g/dL (ref 6.0–8.3)

## 2014-01-19 LAB — FERRITIN: FERRITIN: 23.3 ng/mL (ref 10.0–291.0)

## 2014-01-19 MED ORDER — AMLODIPINE BESYLATE 5 MG PO TABS: 5.0000 mg | ORAL_TABLET | Freq: Every day | ORAL | Status: DC

## 2014-01-19 NOTE — Progress Notes (Signed)
Pre visit review using our clinic review tool, if applicable. No additional management support is needed unless otherwise documented below in the visit note. 

## 2014-01-19 NOTE — Assessment & Plan Note (Addendum)
Acute blood loss,  Secondary to diverticular bleed, presumed.  Hgb dropped from 13 to 10 and has been unchanged since discharge a week ago.  Discussed adding iron supplements

## 2014-01-19 NOTE — Patient Instructions (Addendum)
You do have high blood pressure  BUT:  I am stopping the metoprolol and starting you on amlodipine 5 mg daily  to help your energy  Level. Amlodipine is taken just once daily either morning or evening  I am repeating your hemoglobin and your iron studies today to see if you need additional iron   Continue the omeprazole daily,  But stop the aspirin and aleve   A Diverticular bleed can be aggravated by constipation   If you become constipated,  Please try taking metamucil  Or citrucel  At bedtime \

## 2014-01-19 NOTE — Assessment & Plan Note (Addendum)
metoprolol and amlodipine prescribed during the June 2 hospital admission for GI bleed due to systolic BP of 147.  Has only been taking metoprolol and feels fatigued. Stopping metoprolol and starting amlodipine .

## 2014-01-19 NOTE — Progress Notes (Signed)
Patient ID: Rebekah Ball, female   DOB: 09-01-1920, 80 y.o.   MRN: [id number]  Patient Active Problem List   Diagnosis Date Noted  . Hypertension 09/28/2011  . Anemia     Subjective:  CC:   Chief Complaint  Patient presents with  . Follow-up    Hospital follow up GI bleed 6/2-01/14/14    HPI:   Rebekah Ball is a 80 y.o. female who presents for Hospital follow up.  She was admitted to [facility] on June 2 with lower GI bleeding , presumed due to a diverticular bleed.,   Last occurrence was 2011 hgb dropped from 13 to 10 during admission., transfusion discussed but not done . Discharged on June 3.  Bleeding scan was negative.    Was also treated for hypertension,  Systolic was 017,  Given metoprolol 25 bid and amlodipine 10 mg daily .  Has not picked up the amlodipine  yet.  Feels fatigued on the metoprolol.   Past Medical History  Diagnosis Date  . Anemia     Past Surgical History  Procedure Laterality Date  . Joint replacement      right hip, bilateral knees       The following portions of the patient's history were reviewed and updated as appropriate: Allergies, current medications, and problem list.    Review of Systems:   Patient denies headache, fevers, malaise, unintentional weight loss, skin rash, eye pain, sinus congestion and sinus pain, sore throat, dysphagia,  hemoptysis , cough, dyspnea, wheezing, chest pain, palpitations, orthopnea, edema, abdominal pain, nausea, melena, diarrhea, constipation, flank pain, dysuria, hematuria, urinary  Frequency, nocturia, numbness, tingling, seizures,  Focal weakness, Loss of consciousness,  Tremor, insomnia, depression, anxiety, and suicidal ideation.     History   Social History  . Marital Status: Widowed    Spouse Name: N/A    Number of Children: N/A  . Years of Education: N/A   Occupational History  . Not on file.   Social History Main Topics  . Smoking status: Former Research scientist (life sciences)  . Smokeless tobacco: Not on file  .  Alcohol Use: Yes     Comment: glass of wine q 3-4 months  . Drug Use:   . Sexual Activity:    Other Topics Concern  . Not on file   Social History Narrative  . No narrative on file    Objective:  Filed Vitals:   01/19/14 1151  BP: 148/66  Pulse: 70  Temp: 97.7 F (36.5 C)  Resp: 14     General appearance: alert, cooperative and appears stated age Ears: normal TM's and external ear canals both ears Throat: lips, mucosa, and tongue normal; teeth and gums normal Neck: no adenopathy, no carotid bruit, supple, symmetrical, trachea midline and thyroid not enlarged, symmetric, no tenderness/mass/nodules Back: symmetric, no curvature. ROM normal. No CVA tenderness. Lungs: clear to auscultation bilaterally Heart: regular rate and rhythm, S1, S2 normal, no murmur, click, rub or gallop Abdomen: soft, non-tender; bowel sounds normal; no masses,  no organomegaly Pulses: 2+ and symmetric Skin: Skin color, texture, turgor normal. No rashes or lesions Lymph nodes: Cervical, supraclavicular, and axillary nodes normal.  Assessment and Plan:  Hypertension  metoprolol and amlodipine prescribed during the June 2 hospital admission for GI bleed due to systolic BP of 510.  Has only been taking metoprolol and feels fatigued. Stopping metoprolol and starting amlodipine .    Anemia Acute blood loss,  Secondary to diverticular bleed, presumed.  Hgb dropped from 13  to 10 and has been unchanged since discharge a week ago.  Discussed adding iron supplements    Updated Medication List Outpatient Encounter Prescriptions as of 01/19/2014  Medication Sig  . omeprazole (PRILOSEC) 40 MG capsule Take 40 mg by mouth daily.   . [DISCONTINUED] amLODipine (NORVASC) 10 MG tablet Take 1 tablet (10 mg total) by mouth daily.  . [DISCONTINUED] metoprolol tartrate (LOPRESSOR) 25 MG tablet Take 25 mg by mouth 2 (two) times daily.   Marland Kitchen amLODipine (NORVASC) 5 MG tablet Take 1 tablet (5 mg total) by mouth daily.  .  [DISCONTINUED] aspirin 81 MG tablet Take 81 mg by mouth daily.    . [DISCONTINUED] fish oil-omega-3 fatty acids 1000 MG capsule Take 2 g by mouth daily.    . [DISCONTINUED] glucosamine-chondroitin 500-400 MG tablet Take 1 tablet by mouth daily.    . [DISCONTINUED] naproxen sodium (ANAPROX) 220 MG tablet Take 220 mg by mouth daily.       Orders Placed This Encounter  Procedures  . Ferritin  . CBC with Differential  . Comprehensive metabolic panel  . IBC panel    Return in about 6 months (around 07/21/2014).

## 2014-01-20 ENCOUNTER — Telehealth: Payer: Self-pay | Admitting: Internal Medicine

## 2014-01-20 NOTE — Telephone Encounter (Signed)
Relevant patient education mailed to patient.  

## 2014-01-21 LAB — IBC PANEL
Iron: 37 ug/dL — ABNORMAL LOW (ref 42–145)
Saturation Ratios: 10.5 % — ABNORMAL LOW (ref 20.0–50.0)
Transferrin: 251.1 mg/dL (ref 212.0–360.0)

## 2014-01-21 MED ORDER — FERROUS SULFATE 324 (65 FE) MG PO TBEC: 1.0000 | DELAYED_RELEASE_TABLET | Freq: Every day | ORAL | Status: DC

## 2014-01-21 NOTE — Addendum Note (Signed)
Addended by: Crecencio Mc on: 01/21/2014 04:58 PM   Modules accepted: Orders

## 2014-01-22 DIAGNOSIS — K625 Hemorrhage of anus and rectum: Secondary | ICD-10-CM | POA: Diagnosis not present

## 2014-02-18 ENCOUNTER — Other Ambulatory Visit: Payer: Medicare Other

## 2014-03-17 DIAGNOSIS — L570 Actinic keratosis: Secondary | ICD-10-CM | POA: Diagnosis not present

## 2014-03-17 DIAGNOSIS — L821 Other seborrheic keratosis: Secondary | ICD-10-CM | POA: Diagnosis not present

## 2014-03-17 DIAGNOSIS — Z85828 Personal history of other malignant neoplasm of skin: Secondary | ICD-10-CM | POA: Diagnosis not present

## 2014-04-10 ENCOUNTER — Emergency Department: Payer: Self-pay | Admitting: Emergency Medicine

## 2014-04-10 DIAGNOSIS — S0993XA Unspecified injury of face, initial encounter: Secondary | ICD-10-CM | POA: Diagnosis not present

## 2014-04-10 DIAGNOSIS — S0990XA Unspecified injury of head, initial encounter: Secondary | ICD-10-CM | POA: Diagnosis not present

## 2014-04-10 DIAGNOSIS — R079 Chest pain, unspecified: Secondary | ICD-10-CM | POA: Diagnosis not present

## 2014-04-10 DIAGNOSIS — S0083XA Contusion of other part of head, initial encounter: Secondary | ICD-10-CM | POA: Diagnosis not present

## 2014-04-10 DIAGNOSIS — S199XXA Unspecified injury of neck, initial encounter: Secondary | ICD-10-CM | POA: Diagnosis not present

## 2014-04-10 DIAGNOSIS — S20219A Contusion of unspecified front wall of thorax, initial encounter: Secondary | ICD-10-CM | POA: Diagnosis not present

## 2014-04-10 DIAGNOSIS — S1093XA Contusion of unspecified part of neck, initial encounter: Secondary | ICD-10-CM | POA: Diagnosis not present

## 2014-04-10 DIAGNOSIS — I10 Essential (primary) hypertension: Secondary | ICD-10-CM | POA: Diagnosis not present

## 2014-04-10 DIAGNOSIS — S298XXA Other specified injuries of thorax, initial encounter: Secondary | ICD-10-CM | POA: Diagnosis not present

## 2014-04-10 DIAGNOSIS — S0003XA Contusion of scalp, initial encounter: Secondary | ICD-10-CM | POA: Diagnosis not present

## 2014-05-13 DIAGNOSIS — Z23 Encounter for immunization: Secondary | ICD-10-CM | POA: Diagnosis not present

## 2014-07-14 DIAGNOSIS — H4010X0 Unspecified open-angle glaucoma, stage unspecified: Secondary | ICD-10-CM | POA: Diagnosis not present

## 2014-07-14 DIAGNOSIS — H35341 Macular cyst, hole, or pseudohole, right eye: Secondary | ICD-10-CM | POA: Diagnosis not present

## 2014-07-17 DIAGNOSIS — H3532 Exudative age-related macular degeneration: Secondary | ICD-10-CM | POA: Diagnosis not present

## 2014-07-22 DIAGNOSIS — L720 Epidermal cyst: Secondary | ICD-10-CM | POA: Diagnosis not present

## 2014-07-27 ENCOUNTER — Encounter: Payer: Self-pay | Admitting: Internal Medicine

## 2014-07-27 ENCOUNTER — Ambulatory Visit (INDEPENDENT_AMBULATORY_CARE_PROVIDER_SITE_OTHER): Payer: Medicare Other | Admitting: Internal Medicine

## 2014-07-27 VITALS — BP 168/100 | HR 81 | Temp 98.2°F | Resp 14 | Ht 64.0 in | Wt 153.5 lb

## 2014-07-27 DIAGNOSIS — R5383 Other fatigue: Secondary | ICD-10-CM | POA: Diagnosis not present

## 2014-07-27 DIAGNOSIS — Z23 Encounter for immunization: Secondary | ICD-10-CM | POA: Diagnosis not present

## 2014-07-27 DIAGNOSIS — I10 Essential (primary) hypertension: Secondary | ICD-10-CM

## 2014-07-27 DIAGNOSIS — D500 Iron deficiency anemia secondary to blood loss (chronic): Secondary | ICD-10-CM

## 2014-07-27 DIAGNOSIS — Z862 Personal history of diseases of the blood and blood-forming organs and certain disorders involving the immune mechanism: Secondary | ICD-10-CM

## 2014-07-27 DIAGNOSIS — R634 Abnormal weight loss: Secondary | ICD-10-CM | POA: Diagnosis not present

## 2014-07-27 LAB — CBC WITH DIFFERENTIAL/PLATELET
Basophils Absolute: 0 10*3/uL (ref 0.0–0.1)
Basophils Relative: 0.7 % (ref 0.0–3.0)
Eosinophils Absolute: 0.1 10*3/uL (ref 0.0–0.7)
Eosinophils Relative: 1.3 % (ref 0.0–5.0)
HCT: 43.7 % (ref 36.0–46.0)
Hemoglobin: 13.8 g/dL (ref 12.0–15.0)
Lymphocytes Relative: 30.7 % (ref 12.0–46.0)
Lymphs Abs: 1.8 10*3/uL (ref 0.7–4.0)
MCHC: 31.6 g/dL (ref 30.0–36.0)
MCV: 94.1 fl (ref 78.0–100.0)
Monocytes Absolute: 0.7 10*3/uL (ref 0.1–1.0)
Monocytes Relative: 11.1 % (ref 3.0–12.0)
Neutro Abs: 3.3 10*3/uL (ref 1.4–7.7)
Neutrophils Relative %: 56.2 % (ref 43.0–77.0)
Platelets: 266 10*3/uL (ref 150.0–400.0)
RBC: 4.65 Mil/uL (ref 3.87–5.11)
RDW: 15.2 % (ref 11.5–15.5)
WBC: 5.9 10*3/uL (ref 4.0–10.5)

## 2014-07-27 LAB — TSH: TSH: 3.89 u[IU]/mL (ref 0.35–4.50)

## 2014-07-27 LAB — COMPREHENSIVE METABOLIC PANEL
ALT: 10 U/L (ref 0–35)
AST: 19 U/L (ref 0–37)
Albumin: 3.9 g/dL (ref 3.5–5.2)
Alkaline Phosphatase: 54 U/L (ref 39–117)
BILIRUBIN TOTAL: 0.5 mg/dL (ref 0.2–1.2)
BUN: 12 mg/dL (ref 6–23)
CO2: 27 meq/L (ref 19–32)
CREATININE: 0.9 mg/dL (ref 0.4–1.2)
Calcium: 9 mg/dL (ref 8.4–10.5)
Chloride: 106 mEq/L (ref 96–112)
GFR: 60.78 mL/min (ref >60.00)
Glucose, Bld: 95 mg/dL (ref 70–99)
Potassium: 4 mEq/L (ref 3.5–5.1)
Sodium: 138 mEq/L (ref 135–145)
Total Protein: 7.1 g/dL (ref 6.0–8.3)

## 2014-07-27 LAB — FERRITIN: Ferritin: 36.3 ng/mL (ref 10.0–291.0)

## 2014-07-27 MED ORDER — AMLODIPINE BESYLATE 10 MG PO TABS: 10.0000 mg | ORAL_TABLET | Freq: Every day | ORAL | Status: DC

## 2014-07-27 NOTE — Progress Notes (Signed)
Patient ID: Rebekah Ball, female   DOB: 1920-12-17, 80 y.o.   MRN: [id number]  Patient Active Problem List   Diagnosis Date Noted  . Hypertension 09/28/2011  . Anemia     Subjective:  CC:   Chief Complaint  Patient presents with  . Follow-up    last seen in June for hospital folow up for anemia.    HPI:   Rebekah Ball is a 80 y.o. female who presents for   Past Medical History  Diagnosis Date  . Anemia     Past Surgical History  Procedure Laterality Date  . Joint replacement      right hip, bilateral knees       The following portions of the patient's history were reviewed and updated as appropriate: Allergies, current medications, and problem list.    Review of Systems:   Patient denies headache, fevers, malaise, unintentional weight loss, skin rash, eye pain, sinus congestion and sinus pain, sore throat, dysphagia,  hemoptysis , cough, dyspnea, wheezing, chest pain, palpitations, orthopnea, edema, abdominal pain, nausea, melena, diarrhea, constipation, flank pain, dysuria, hematuria, urinary  Frequency, nocturia, numbness, tingling, seizures,  Focal weakness, Loss of consciousness,  Tremor, insomnia, depression, anxiety, and suicidal ideation.     History   Social History  . Marital Status: Widowed    Spouse Name: N/A    Number of Children: N/A  . Years of Education: N/A   Occupational History  . Not on file.   Social History Main Topics  . Smoking status: Former Research scientist (life sciences)  . Smokeless tobacco: Not on file  . Alcohol Use: Yes     Comment: glass of wine q 3-4 months  . Drug Use: Not on file  . Sexual Activity: Not on file   Other Topics Concern  . Not on file   Social History Narrative    Objective:  Filed Vitals:   07/27/14 0901  BP: 168/100  Pulse: 81  Temp: 98.2 F (36.8 C)  Resp: 14     General appearance: alert, cooperative and appears stated age Ears: normal TM's and external ear canals both ears Throat: lips, mucosa, and tongue  normal; teeth and gums normal Neck: no adenopathy, no carotid bruit, supple, symmetrical, trachea midline and thyroid not enlarged, symmetric, no tenderness/mass/nodules Back: symmetric, no curvature. ROM normal. No CVA tenderness. Lungs: clear to auscultation bilaterally Heart: regular rate and rhythm, S1, S2 normal, no murmur, click, rub or gallop Abdomen: soft, non-tender; bowel sounds normal; no masses,  no organomegaly Pulses: 2+ and symmetric Skin: Skin color, texture, turgor normal. No rashes or lesions Lymph nodes: Cervical, supraclavicular, and axillary nodes normal.  Assessment and Plan:  Problem List Items Addressed This Visit      Cardiovascular and Mediastinum   Hypertension    Uncontrolled secondary to lapse in medication.  dod not tolerate metoprolol due to fatigue. She has normal renal function.  Resume amlodipine 10 mg daily Asked to have RN at [facility] recheck BP in one week.  Lab Results  Component Value Date   NA 138 07/27/2014   K 4.0 07/27/2014   CL 106 07/27/2014   CO2 27 07/27/2014   Lab Results  Component Value Date   CREATININE 0.9 07/27/2014       Relevant Medications      amLODIpine (NORVASC) tablet     Other   Anemia    Diagnosed in June 2015, due to diverticular bleed, presumed.  Hgb dropped from 13 to 10 and she was  advised to take an iron supplement, which she did for a month or so. Her anemia has resolved.   Lab Results  Component Value Date   WBC 5.9 07/27/2014   HGB 13.8 07/27/2014   HCT 43.7 07/27/2014   MCV 94.1 07/27/2014   PLT 266.0 07/27/2014        Other Visit Diagnoses    History of anemia    -  Primary    Relevant Orders       CBC with Differential (Completed)       Ferritin (Completed)       Iron and TIBC (Completed)    Other fatigue        Relevant Orders       Comp Met (CMET) (Completed)    Loss of weight        Relevant Orders       TSH (Completed)    Need for prophylactic vaccination against Streptococcus  pneumoniae (pneumococcus)        Relevant Orders       Pneumococcal conjugate vaccine 13-valent (Completed)

## 2014-07-27 NOTE — Patient Instructions (Addendum)
Your blood pressure is elvated today,  You need to resume amlodipinbe.  Please  [facility] BP in one week to make sure we have it well controlled  You received the prevnar pneumonia vaccine today  We will contact you with the labs from today

## 2014-07-27 NOTE — Progress Notes (Signed)
Pre-visit discussion using our clinic review tool. No additional management support is needed unless otherwise documented below in the visit note.  

## 2014-07-28 LAB — IRON AND TIBC
%SAT: 31 % (ref 20–55)
Iron: 102 ug/dL (ref 42–145)
TIBC: 329 ug/dL (ref 250–470)
UIBC: 227 ug/dL (ref 125–400)

## 2014-07-28 NOTE — Assessment & Plan Note (Signed)
Diagnosed in June 2015, due to diverticular bleed, presumed.  Hgb dropped from 13 to 10 and she was advised to take an iron supplement, which she did for a month or so. Her anemia has resolved.   Lab Results  Component Value Date   WBC 5.9 07/27/2014   HGB 13.8 07/27/2014   HCT 43.7 07/27/2014   MCV 94.1 07/27/2014   PLT 266.0 07/27/2014

## 2014-07-28 NOTE — Assessment & Plan Note (Signed)
Uncontrolled secondary to lapse in medication.  dod not tolerate metoprolol due to fatigue. She has normal renal function.  Resume amlodipine 10 mg daily Asked to have RN at [facility] recheck BP in one week.  Lab Results  Component Value Date   NA 138 07/27/2014   K 4.0 07/27/2014   CL 106 07/27/2014   CO2 27 07/27/2014   Lab Results  Component Value Date   CREATININE 0.9 07/27/2014

## 2014-09-02 DIAGNOSIS — H3532 Exudative age-related macular degeneration: Secondary | ICD-10-CM | POA: Diagnosis not present

## 2014-09-09 DIAGNOSIS — H4011X2 Primary open-angle glaucoma, moderate stage: Secondary | ICD-10-CM | POA: Diagnosis not present

## 2014-10-06 ENCOUNTER — Other Ambulatory Visit: Payer: Self-pay | Admitting: Internal Medicine

## 2014-10-14 DIAGNOSIS — H3532 Exudative age-related macular degeneration: Secondary | ICD-10-CM | POA: Diagnosis not present

## 2014-11-25 DIAGNOSIS — H3532 Exudative age-related macular degeneration: Secondary | ICD-10-CM | POA: Diagnosis not present

## 2014-12-02 DIAGNOSIS — H3532 Exudative age-related macular degeneration: Secondary | ICD-10-CM | POA: Diagnosis not present

## 2014-12-05 NOTE — Consult Note (Signed)
PATIENT NAME:  Rebekah Ball, Rebekah Ball MR#:  [id number] DATE OF BIRTH:  1920/09/10  DATE OF CONSULTATION:  01/10/2014  CONSULTING PHYSICIAN:  Lollie Sails, MD  REASON FOR CONSULTATION: Lower GI bleed/hematochezia.   HISTORY OF PRESENT ILLNESS: Ms. Wanninger is a very pleasant, 81 year old Caucasian female, with whom I am familiar. I saw her several years ago in regards to a similar presentation of lower GI bleeding. She states that she was doing well in her usual state of health until this morning, when she was getting ready to go to breakfast and had a bowel movement, passing a lot of blood at that time. That was at about 7:30 this morning. She has had some nausea, but she denies any abdominal pain either before or since this episode. There is no vomiting. She typically has a bowel movement on a daily basis, although in the past several weeks she has noted her bowel movements to be more soft, more like semiformed/pudding. She does not have to strain to have a bowel movement. She has noted her appetite to be decreasing some over the period of the past 2 to 3 months. There has been no weight loss, however. She has no heartburn or dysphagia. She denies seeing any black stools, blood in the stool, or slimy stools until this episode this morning.   Previous colonoscopy on 05/13/2010 for hematochezia, this showing diverticulosis and some internal hemorrhoids. She also had an EGD on 05/09/2010 for melena, showing a hiatal hernia and some gastritis, with no active bleeding source. Also, there was no active bleeding source noted on the colonoscopy.   PAST MEDICAL HISTORY:  1.  Previous history of lower GI bleed, likely diverticular, but of uncertain etiology.  2.  Osteoarthritis.  3.  Benign hypertension.  4.  History of bilateral knee surgery.  5.  History of bilateral hip surgery.  6.  Status post hysterectomy.  7.  History of anxiety.   OUTPATIENT MEDICATIONS: Include Aleve, which she takes 220 mg once a day.  She has done this for a long period of time. She also takes an 81 mg aspirin once a day.   REVIEW OF SYSTEMS:  Ten systems reviewed, per admission history and physical, negative with the exception of above.   PHYSICAL EXAMINATION: GENERAL: She is a 81 year old Caucasian female, somewhat anxious, but no acute distress otherwise.  VITAL SIGNS: Temperature is 98.2, pulse 71, respirations 19, blood pressure 149/76, pulse ox is 97%.  HEENT: Normocephalic, atraumatic. Eyes are anicteric. Nose: Septum midline, no lesions.  Oropharynx: No lesions.  NECK: Supple. No JVD. No lymphadenopathy.  HEART: Regular rate and rhythm, without rub or gallop.  LUNGS: Bilaterally clear.  ABDOMEN: Soft, nontender, nondistended. Bowel sounds positive, normoactive.  RECTAL: Anorectal examination shows a thick, dark maroon effluent from appearance consistent with lower GI bleed/diverticular.  EXTREMITIES: No clubbing, cyanosis, or edema.  NEUROLOGIC: Cranial nerves II through XII grossly intact. Muscle strength bilaterally equal and symmetric. DTRs bilaterally equal and symmetric.   LABORATORIES INCLUDE THE FOLLOWING: She has a glucose of 127, BUN 21, creatinine 0.97, sodium 139, potassium 4.4, chloride 111, bicarb 23, osmolality 282, calcium 8.7. Hepatic profile was normal, with the exception of a slightly low albumin at 3.3. Her hemogram is normal, with a white count of 8.4, H and H 13.2/40.8, platelet count of 230, MCV is 94. Her pro time is 13.1, INR 1.0. She had a urinalysis showing 3+ blood, trace leukocyte esterase. She has not had any imaging.   ASSESSMENT:  1.  Lower gastrointestinal bleed. My clinical impression is that she is having a diverticular bleeding episode. She is hemodynamically stable, with a normal hemogram.  2.  The patient has been taking a daily NSAID as well as a daily aspirin.   RECOMMENDATION: 1.  Serial hemoglobins q. 12 hours, transfuse as needed.  2.  Agree with current Protonix 40 mg IV  twice a day, although I doubt this is an upper GI bleed. Will need to follow clinically.  3.  If there is evidence of recurrent bleeding, would recommend a tagged bleeding scan to help delineate a possible source.  4.  Clear liquid diet okay for now, will modify change to no red.   Will follow with you. Thank you for this consult.    ____________________________ Lollie Sails, MD mus:mr D: 01/10/2014 18:18:14 ET T: 01/10/2014 18:51:15 ET JOB#: [id number]  cc: Lollie Sails, MD, <Dictator> Lollie Sails MD ELECTRONICALLY SIGNED 02/05/2014 9:09

## 2014-12-05 NOTE — Consult Note (Signed)
Chief Complaint:  Subjective/Chief Complaint seen for hematochezia.  denies n/v or abdominalpain.   VITAL SIGNS/ANCILLARY NOTES: **Vital Signs.:   31-May-15 14:49  Vital Signs Type Routine  Temperature Temperature (F) 97.4  Temperature Source oral  Pulse Pulse 57  Respirations Respirations 18  Systolic BP Systolic BP 456  Diastolic BP (mmHg) Diastolic BP (mmHg) 55  Mean BP 80  Pulse Ox % Pulse Ox % 94  Pulse Ox Activity Level  At rest  Oxygen Delivery Room Air/ 21 %  *Intake and Output.:   31-May-15 06:50  Stool  small formed stool    11:00  Stool  toilet flushed, patient stated have blood on tissue    13:10  Stool  small loose, dark heavy blood with few clots when wiped   Brief Assessment:  Cardiac Regular   Respiratory clear BS   Gastrointestinal details normal Soft  Nontender  Nondistended  No masses palpable  Bowel sounds normal   Lab Results: Routine Chem:  31-May-15 03:29   Glucose, Serum 92  BUN 18  Creatinine (comp) 0.87  Sodium, Serum 143  Potassium, Serum 3.8  Chloride, Serum  114  CO2, Serum 24  Calcium (Total), Serum  8.0  Anion Gap  5  Osmolality (calc) 287  eGFR (African American) >60  eGFR (Non-African American)  59 (eGFR values <1m/min/1.73 m2 may be an indication of chronic kidney disease (CKD). Calculated eGFR is useful in patients with stable renal function. The eGFR calculation will not be reliable in acutely ill patients when serum creatinine is changing rapidly. It is not useful in  patients on dialysis. The eGFR calculation may not be applicable to patients at the low and high extremes of body sizes, pregnant women, and vegetarians.)  Routine Hem:  30-May-15 12:29   Hemoglobin (CBC) 13.2    18:20   Hemoglobin (CBC)  11.7 (Result(s) reported on 10 Jan 2014 at 06:49PM.)  31-May-15 03:29   Hemoglobin (CBC)  10.1  WBC (CBC) 7.5  RBC (CBC)  3.32  Hematocrit (CBC)  31.0  Platelet Count (CBC) 194  MCV 94  MCH 30.4  MCHC 32.5  RDW  14.0  Neutrophil % 58.8  Lymphocyte % 26.8  Monocyte % 13.1  Eosinophil % 0.9  Basophil % 0.4  Neutrophil # 4.4  Lymphocyte # 2.0  Monocyte #  1.0  Eosinophil # 0.1  Basophil # 0.0 (Result(s) reported on 11 Jan 2014 at 04:24AM.)    14:03   Hemoglobin (CBC)  10.2 (Result(s) reported on 11 Jan 2014 at 02:17PM.)   Radiology Results: Nuclear Med:    31-May-15 00:13, GI Blood Loss Study - Nuc Med  GI Blood Loss Study - Nuc Med   REASON FOR EXAM:    positive occult blood  COMMENTS:       PROCEDURE: NM  - NM GI BLOOD LOSS STUDY  - Jan 11 2014 12:13AM     CLINICAL DATA:  81year old female with bright red blood per rectum,  hematocrit dropped from 13 to 11. Initial encounter.    EXAM:  NUCLEAR MEDICINE GASTROINTESTINAL BLEEDING SCAN    TECHNIQUE:  Sequential abdominal images were obtained following intravenous  administration of Tc-948mabeled red blood cells.  RADIOPHARMACEUTICALS:  20.7 mCi Tc-9971m-vitro labeled red cells.    COMPARISON:  05/10/2010.    FINDINGS:  Normal blood pool radiotracer activity identified in the chest and  abdomen. No abnormal radiotracer accumulation, or abnormal  radiotracer motion in the abdomen or pelvis to suggest active  gastrointestinal bleeding. Bladder activity slowly accumulates over  the course of the study.     IMPRESSION:  No  active gastrointestinal bleeding identified.    Electronically Signed    By: Lars Pinks M.D.    On: 01/11/2014 01:02         Verified By: Gwenyth Bender. HALL, M.D.,   Assessment/Plan:  Assessment/Plan:  Assessment 1) hematochezia-likely diverticular bleeding, with h/o similar several years ago.  Bleeding scan ast night negative for active bleeding, hemodynamically and hemoglobin stable. soem bloody stool today, likely old.   Plan 1) continue serial hgb, transfuse as needed, continue clear liquids for now. following.   Electronic Signatures: Loistine Simas (MD)  (Signed [phone] 16:46)  Authored: Chief  Complaint, VITAL SIGNS/ANCILLARY NOTES, Brief Assessment, Lab Results, Radiology Results, Assessment/Plan   Last Updated: 31-May-15 16:46 by Loistine Simas (MD)

## 2014-12-05 NOTE — Consult Note (Signed)
Brief Consult Note: Diagnosis: hematochezia.   Patient was seen by consultant.   Consult note dictated.   Recommend further assessment or treatment.   Orders entered.   Discussed with Attending MD.   Comments: Please see full GI consult [phone].  81 yo f admitted with maroon hematochezia.  hemodynamically stable, no abdominal pain.  Normal hemogram.  Similar episode in 04/2010, likely diverticular, though no source found with egd and colonoscopy.  Evaluation positive for diverticuolosis and internal hemorrhoids, and hiatal hernia with mild gastritis at that time. Continue serial hgb, transfuse as needed.  If there is a recurrent episode of significant bleeding recommend bleeding scan to help delineate possible source.  Following.  Electronic Signatures: Loistine Simas (MD)  (Signed [phone] 18:23)  Authored: Brief Consult Note   Last Updated: 30-May-15 18:23 by Loistine Simas (MD)

## 2014-12-05 NOTE — Consult Note (Signed)
Chief Complaint:  Subjective/Chief Complaint seen for GI bleeding/hematochezia.  stable, no n or abdominalpain.  few small "smear" of blood noted.   VITAL SIGNS/ANCILLARY NOTES: **Vital Signs.:   02-Jun-15 17:36  Vital Signs Type Q 4hr  Temperature Temperature (F) 97.5  Temperature Source axillary  Pulse Pulse 67  Respirations Respirations 20  Systolic BP Systolic BP 372  Diastolic BP (mmHg) Diastolic BP (mmHg) 66  Mean BP 94  Pulse Ox % Pulse Ox % 67  Pulse Ox Activity Level  At rest  Oxygen Delivery Room Air/ 21 %  *Intake and Output.:   02-Jun-15 04:10  Stool  Smears of blood.   Brief Assessment:  Cardiac Regular   Respiratory clear BS   Gastrointestinal details normal Soft  Nontender  Nondistended  Bowel sounds normal   Lab Results: Routine Hem:  30-May-15 12:29   Hemoglobin (CBC) 13.2    18:20   Hemoglobin (CBC)  11.7 (Result(s) reported on 10 Jan 2014 at 06:49PM.)  31-May-15 03:29   Hemoglobin (CBC)  10.1    14:03   Hemoglobin (CBC)  10.2 (Result(s) reported on 11 Jan 2014 at 02:17PM.)  01-Jun-15 04:21   Hemoglobin (CBC)  10.2 (Result(s) reported on 12 Jan 2014 at 04:52AM.)  02-Jun-15 09:27   Hemoglobin (CBC)  10.2 (Result(s) reported on 13 Jan 2014 at 09:50AM.)   Assessment/Plan:  Assessment/Plan:  Assessment 1) hematochezia-likely diverticular.  stablebloody smears probably residue.   Plan 1) advance diet to full liquid in am.  will need GI o/p fu in about 2 weeks.   Electronic Signatures: Loistine Simas (MD)  (Signed 02-Jun-15 18:47)  Authored: Chief Complaint, VITAL SIGNS/ANCILLARY NOTES, Brief Assessment, Lab Results, Assessment/Plan   Last Updated: 02-Jun-15 18:47 by Loistine Simas (MD)

## 2014-12-05 NOTE — Discharge Summary (Signed)
PATIENT NAME:  Rebekah Ball, Rebekah Ball MR#:  [id number] DATE OF BIRTH:  09-20-1920  DATE OF ADMISSION:  01/13/2014 DATE OF DISCHARGE:  01/14/2014  ADMITTING DIAGNOSIS: Gastrointestinal bleed.   DISCHARGE DIAGNOSES:  1. Acute posthemorrhagic anemia,  gastrointestinal bleed, suspected diverticular, resolved off aspirin therapy. 2. Accelerated hypertension.  3. Anxiety. 4. Dehydration.  5. Renal insufficiency. Resolved on IV fluids.  6. History of transient ischemic attack.  7. Arthritis.  8. Difficulty hearing with hearing aids.   DISCHARGE CONDITION: Stable.   DISCHARGE MEDICATIONS: The patient is to continue metoprolol tartrate 25  mg twice daily, amlodipine 10 mg p.o. daily, omeprazole 20 mg p.o. daily, Ultram 50 mg p.o. every 6 hours as needed, lisinopril 5 mg p.o. at bedtime. The patient is to stop Aleve as well as aspirin.   HOME OXYGEN: None.   DIET: Low salt, low fat, low cholesterol, low residue, mechanical soft diet to be advanced to regular over the next week.   FOLLOWUP APPOINTMENTS:  Dr. Derrel Nip 2 days after discharge, Dr. Gustavo Lah 1 week after discharge.   CONSULTANTS: Care management, Dr. Gustavo Lah, social work.   RADIOLOGIC STUDIES: GI blood loss study, 01/11/2014, revealed no active gastrointestinal bleeding.   HOSPITAL COURSE: The patient is a 81 year old Caucasian female with past medical history significant for history of arthritis who has been on aspirin therapy as well as Aleve for arthritis as well as history of TIA who presents to the hospital on the 2nd of June with gastrointestinal bleed. Please refer to Dr. Doy Hutching' admission note.   Patient had bright red blood per rectum and no abdominal pain and was admitted to hospital for further evaluation and treatment. On arrival to the hospital, patient's blood pressure was 192/91, heart rate was 80, respiration rate was 16, temperature was 97.9, and saturation was 94% on room air.  Physical exam was unremarkable. Patient's lab data  done on admission, 01/10/2014, showed a glucose of 127, BUN of 21, otherwise BMP was unremarkable. The patient's liver enzymes revealed albumin level of 3.3, otherwise unremarkable study. CBC: White blood cell count was 8.4, hemoglobin is 13.2, platelet count was 230,000. Coagulation panel: Pro time was 13.1 and INR was 1.0. Urinalysis was unremarkable except there are 5 red blood cells and 2 white blood cells, trace bacteria, trace leukocyte esterase. Occult blood in feces study was positive.  EKG revealed normal sinus rhythm at 72 beats per minute. Moderate voltage criteria for LVH and no acute ST-T changes were noted. Patient  was admitted to the hospital for further evaluation.   In regard to gastrointestinal bleed, it was felt that gastrointestinal bleed was very likely diverticula. The patient's aspirin as well as Aleve were stopped and patient's bleeding subsided. She was hydrated with IV fluids and was noted to have drifting down  hemoglobin level. On the day of admission as mentioned above, the patient's hemoglobin level was 13.2.  It is slightly down to 11.7 by 01/10/2014 evening and dropped down to 10.1 on the 01/11/2014.  It remained stable till the day of discharge. The patient was evaluated by Dr. Gustavo Lah who did not feel that patient needs or warrants any further investigation at this time; however, he recommended to follow up with him as outpatient.  Patient was advised to stop her Aleve as well as aspirin and follow up with her primary care physician, Dr. Derrel Nip, as well as Dr. Gustavo Lah in regard to management of aspirin therapy.  In regard to accelerated hypertension, the patient's blood pressure medications were  advanced to current levels and her blood pressure improved.  For anxiety, patient received some Xanax while she was in the hospital.   In regard to renal insufficiency, as mentioned above, the patient's BUN was slightly elevated when she came in to the hospital to 21.  However,  creatinine remains stable at 0.97 and the patient was hydrated and her BUN normalized by 01/11/2014.   History of TIA. Patient's aspirin has been stopped.  It is to be restarted according to primary care physician as well as Dr. Gustavo Lah' recommendations.   In regard to arthritis, patient was advised to stop her Aleve and she was initiated on Ultram.  She is to follow up with her primary care physician and make decisions about arthritis therapy.   For difficulty hearing, patient was advised to continue hearing aids.   DISPOSITION:  The patient is being discharged in stable condition with the above-mentioned indications and followup. Her vital signs on the day of discharge, temperature was 98, pulse was 51, respirations were 18, blood pressure 138/58, saturation was 95% on room air at rest.   TIME SPENT: 40 minutes.    ____________________________ Theodoro Grist, MD rv:dd D: 01/14/2014 19:29:00 ET T: 01/14/2014 20:12:17 ET JOB#: [id number]  cc: Theodoro Grist, MD, <Dictator> Deborra Medina, MD Lollie Sails, MD  [place] MD ELECTRONICALLY SIGNED 01/29/2014 11:15

## 2014-12-05 NOTE — H&P (Signed)
PATIENT NAME:  DASANI, THURLOW MR#:  [id number] DATE OF BIRTH:  [birth year]  DATE OF ADMISSION:  01/10/2014  REFERRING PHYSICIAN:  Dr. Benjaman Lobe.   FAMILY PHYSICIAN:  Dr. Deborra Medina.   REASON FOR ADMISSION:  Lower gastrointestinal bleed.   HISTORY OF PRESENT ILLNESS:  The patient is a 81 year old female who resides at [facility] in independent living.  The patient has a significant history of osteoarthritis, benign hypertension, and anxiety.  Had lower GI bleed approximately four years ago for which he was hospitalized.  Work-up at that time was negative.  Has had no recurrence until today when she developed painless red blood per rectum.  Presented to the Emergency Room where she was found to have bright red blood per rectum and is now admitted for further evaluation.   PAST MEDICAL HISTORY: 1.  History of lower GI bleed of unclear etiology.  2.  Osteoarthritis.  3.  Anxiety.  4.  Benign hypertension.  5.  Status post left knee surgery.  6.  Status post right hip surgery.  7.  Status post hysterectomy.   MEDICATIONS: 1.  Aspirin 81 mg by mouth daily.  2.  Aleve 1 tablet every eight hours as needed for pain.   ALLERGIES:  MORPHINE AND TAPE.   SOCIAL HISTORY:  The patient has a remote history of tobacco abuse, but none recently.  No history of alcohol abuse.   FAMILY HISTORY:  Positive for hypertension, stroke and coronary artery disease.  Negative for colon or breast cancer.   REVIEW OF SYSTEMS:  CONSTITUTIONAL:  No fever or change in weight.  EYES:  No blurred or double vision.  No glaucoma.  EARS, NOSE, THROAT:  No tinnitus or hearing loss.  No nasal discharge or bleeding.  No difficulty swallowing.  RESPIRATORY:  No cough or wheezing.  Denies hemoptysis.  CARDIOVASCULAR:  No chest pain or orthopnea.  No palpitations or syncope.  GASTROINTESTINAL:  No nausea, vomiting.  No abdominal pain.  GENITOURINARY:  No dysuria or hematuria.  No incontinence.  ENDOCRINE:  No  polyuria or polydipsia.  No heat or cold intolerance.  HEMATOLOGIC:  The patient denies anemia, easy bruising.  LYMPHATIC:  No swollen glands.  MUSCULOSKELETAL:  The patient denies pain in her neck, shoulders, although she does have chronic back, hip, and knee pain.  No gout.  NEUROLOGIC:  No numbness or migraines.  Denies stroke or seizures.  PSYCHIATRIC:  The patient denies anxiety, insomnia or depression.   PHYSICAL EXAMINATION: GENERAL:  The patient is elderly, in no acute distress.  VITAL SIGNS:  Currently remarkable for blood pressure of 192/91, heart rate 80, respiratory rate 16, temperature 97.9, sat 94% on room air.  HEENT:  Normocephalic, atraumatic.  Pupils equal, round and reactive to light and accommodation, extraocular movements are intact.  Sclerae are nonicteric.  Conjunctivae are clear.  Oropharynx is dry, but clear.  NECK:  Supple without JVD.  No adenopathy or thyromegaly is noted.  LUNGS:  Clear to auscultation and percussion without wheezes, rales or rhonchi.  No dullness.  Respiratory effort is normal.  CARDIAC:  Regular rate and rhythm.  Normal S1, S2.  There is a 2 over 6 systolic murmur noted throughout the precordium.  No rubs or gallops are present.  PMI is nondisplaced.  Chest wall is nontender.  ABDOMEN:  Soft, nontender, with normoactive bowel sounds.  No organomegaly or masses were appreciated.  No hernias or bruits were noted.  RECTAL:  Revealed guaiac stool per  the Emergency Room physician.  EXTREMITIES:  Without clubbing, cyanosis or edema.  Pulses were 2+ bilaterally.  SKIN:  Warm and dry without rash or lesions.  NEUROLOGIC:  Cranial nerves II through XII grossly intact.  Deep tendon reflexes were symmetric.  Motor and sensory exam is nonfocal.   PSYCHIATRIC:  Revealed a patient that was extremely anxious, but was alert and oriented to person, place, and time.  She was cooperative and used good judgment.   LABORATORY DATA:  Pro Time was 13.1 with an INR of  1.0.  White count was 8.4 with a hemoglobin of 13.2.  Sugar was 127, with a BUN of 21, creatinine of 0.97 and a GFR of 51.   ASSESSMENT: 1.  Lower gastrointestinal bleed.  2.  Dehydration.  3.  Renal insufficiency.  4.  Hyperglycemia.  5.  Anxiety.  6.  Osteoarthritis.   PLAN:  The patient will be observed on the floor with intravenous fluids.  We will follow her hemoglobin closely and guaiac all stools.  We will obtain a gastroenterology consult.  We will use Xanax for anxiety.  We will begin amlodipine and metoprolol for blood pressure control.  Follow up routine labs in the morning.  Clear liquid diet for now.  Further treatment and evaluation will depend upon the patient's progress.   Total time spent on this patient was 50 minutes.     ____________________________ Leonie Douglas Doy Hutching, MD jds:ea D: 01/10/2014 14:44:27 ET T: 01/10/2014 17:00:38 ET JOB#: [id number]  cc: Leonie Douglas. Doy Hutching, MD, <Dictator> Deborra Medina, MD Krystle Oberman Lennice Sites MD ELECTRONICALLY SIGNED 01/10/2014 18:26

## 2014-12-05 NOTE — Consult Note (Signed)
Chief Complaint:  Subjective/Chief Complaint patient seen for hematochezia.  2 small loose bloody stools today, no abdominalpain nausea or vomiting.   VITAL SIGNS/ANCILLARY NOTES: **Vital Signs.:   01-Jun-15 14:19  Temperature Temperature (F) 97.8  Celsius 36.5  Temperature Source oral  Pulse Pulse 58  Respirations Respirations 18  Systolic BP Systolic BP 175  Diastolic BP (mmHg) Diastolic BP (mmHg) 72  Mean BP 98  Pulse Ox % Pulse Ox % 95  Pulse Ox Activity Level  At rest  Oxygen Delivery Room Air/ 21 %  *Intake and Output.:   01-Jun-15 01:03  Stool  small, loose, dark colored blood,no clots   Brief Assessment:  Cardiac Regular   Respiratory clear BS   Gastrointestinal details normal Soft  Nontender  Nondistended  Bowel sounds normal   Lab Results: Routine Hem:  30-May-15 12:29   Hemoglobin (CBC) 13.2    18:20   Hemoglobin (CBC)  11.7 (Result(s) reported on 10 Jan 2014 at 06:49PM.)  31-May-15 03:29   Hemoglobin (CBC)  10.1    14:03   Hemoglobin (CBC)  10.2 (Result(s) reported on 11 Jan 2014 at 02:17PM.)  01-Jun-15 04:21   Hemoglobin (CBC)  10.2 (Result(s) reported on 12 Jan 2014 at 04:52AM.)   Radiology Results: Nuclear Med:    31-May-15 00:13, GI Blood Loss Study - Nuc Med  GI Blood Loss Study - Nuc Med   REASON FOR EXAM:    positive occult blood  COMMENTS:       PROCEDURE: NM  - NM GI BLOOD LOSS STUDY  - Jan 11 2014 12:13AM     CLINICAL DATA:  81 year old female with bright red blood per rectum,  hematocrit dropped from 13 to 11. Initial encounter.    EXAM:  NUCLEAR MEDICINE GASTROINTESTINAL BLEEDING SCAN    TECHNIQUE:  Sequential abdominal images were obtained following intravenous  administration of Tc-41m labeled red blood cells.  RADIOPHARMACEUTICALS:  20.7 mCi Tc-52m in-vitro labeled red cells.    COMPARISON:  05/10/2010.    FINDINGS:  Normal blood pool radiotracer activity identified in the chest and  abdomen. No abnormal radiotracer  accumulation, or abnormal  radiotracer motion in the abdomen or pelvis to suggest active  gastrointestinal bleeding. Bladder activity slowly accumulates over  the course of the study.     IMPRESSION:  No  active gastrointestinal bleeding identified.    Electronically Signed    By: Lars Pinks M.D.    On: 01/11/2014 01:02         Verified By: Gwenyth Bender. HALL, M.D.,   Assessment/Plan:  Assessment/Plan:  Assessment 1) hematochezia in the setting of history of previous diverticular bleeding. small amount today.  bleeding scan negative 2 nights ago. hemodynamically stable. hgb stable.   Plan 1) continue current, may advance diet to full liquids.  continue following.  case discussed with Dr Charissa Bash.   Electronic Signatures: Loistine Simas (MD)  (Signed 01-Jun-15 17:48)  Authored: Chief Complaint, VITAL SIGNS/ANCILLARY NOTES, Brief Assessment, Lab Results, Radiology Results, Assessment/Plan   Last Updated: 01-Jun-15 17:48 by Loistine Simas (MD)

## 2014-12-06 NOTE — Op Note (Signed)
PATIENT NAME:  Rebekah Ball, Rebekah Ball MR#:  [id number] DATE OF BIRTH:  1920-08-18  DATE OF PROCEDURE:  10/25/2011  PREOPERATIVE DIAGNOSIS: Degenerative arthrosis of the left hip.   POSTOPERATIVE DIAGNOSIS: Degenerative arthrosis of the left hip.   PROCEDURE PERFORMED: Left total hip arthroplasty.   SURGEON: Laurice Record. Holley Bouche., MD  ASSISTANT: Vance Peper, PA-C (required to maintain retraction throughout the procedure)   ESTIMATED BLOOD LOSS: 400 mL.   FLUIDS REPLACED: 900 mL of crystalloid.   DRAINS: Two medium drains to Hemovac reservoir.   IMPLANTS UTILIZED: DePuy 13.5 mm small stature Prodigy femoral component, 54 mm outer diameter Pinnacle 100 acetabular component, +4 mm 10 degree Pinnacle Marathon polyethylene liner, and a 36 mm M-Spec femoral head with a +5 mm neck length.   INDICATIONS FOR SURGERY: Patient is an 81 year old female who has been seen for complaints of progressive left hip and groin pain. X-rays demonstrated significant degenerative changes. After discussion of the risks and benefits of surgical intervention, the patient expressed her understanding of the risks, benefits and agreed with plans for surgical intervention.   PROCEDURE IN DETAIL: Patient was brought into the Operating Room and, after adequate spinal and general anesthesia was achieved, the patient was placed in a right lateral decubitus position. Axillary roll was placed and all bony prominences were well padded. Patient's left hip and leg were cleaned and prepped with alcohol and DuraPrep, draped in the usual sterile fashion. A "timeout" was performed as per usual protocol. A lateral curvilinear incision was made gently curving towards the posterior superior iliac spine. IT band was incised in line with the skin incision and fibers of the gluteus maximus were split in line. The piriformis tendon was identified, skeletonized, and incised at its insertion on the proximal femur, reflected posteriorly. In a similar fashion,  short external rotators were incised and reflected posteriorly. A T-type posterior capsulotomy was performed. Prior to dislocation of the femoral head, a threaded Steinmann pin was inserted through a separate stab incision into the pelvis superior to the acetabulum and bent in the form of a stylus so as to assess limb length and hip offset throughout the procedure. Femoral head was then dislocated posteriorly. Severe degenerative changes were noted with full thickness loss of articular cartilage noted superiorly. Femoral neck cut was performed using oscillating saw. Anterior capsule was elevated off of the femoral neck. Inspection of the acetabulum also demonstrated severe degenerative changes. A relatively large labrum was excised. The acetabulum was reamed in a sequential fashion up to a 53 mm diameter. Good punctate bleeding bone was encountered. A 54 mm outer diameter Pinnacle 100 acetabular component was positioned and impacted into place. Good scratch fit was appreciated. First a +4 and subsequently a +4 10 degree trial liner was placed and attention was directed to the proximal femur. Pilot hole for reaming of the proximal femoral canal was created using a high-speed bur. Proximal femoral canal was reamed in sequential fashion up to a 13 mm diameter. 13.5 mm aggressive side-biting reamer was used to prepare the calcar region. Serial broaches were inserted up to a 13.5 mm small stature broach. Calcar region was planed and trial reduction was performed using first a +1.5 and subsequently a +5 mm neck length. Excellent stability was appreciated anteriorly. There was some concern about posterior stability. The AML neck segment was replaced with the Prodigy neck segment and reduction again performed. This allowed for excellent anterior and posterior stability. Good equalization of limb lengths was noted and improvement  of hip offset was appreciated.   Trial components were removed. The acetabular shell was  irrigated and suctioned dry. A +4 10 degree Pinnacle Marathon polyethylene liner was positioned and impacted into place with high side position at approximately 4:00 position. Next, a 13.5 mm small stature Prodigy femoral component was positioned, impacted into place. Good scratch fit was appreciated. Trial reduction was again performed with a 36 mm hip ball with a +5 mm neck length. Again, excellent stability was appreciated both anteriorly and posteriorly and good restoration of the limb lengths was appreciated. Trial hip ball was removed. The Morse taper was cleaned and dried. A 36 mm outer diameter M-Spec femoral head with a +5 mm neck length was placed on the trunnion and impacted in place. Hip was reduced and placed through a range of motion. Again, good equalization of limb lengths was appreciated and good restoration of hip offset. Excellent stability was noted both anteriorly and posteriorly.   The wound was irrigated with copious amounts of normal saline with antibiotic solution using pulsatile lavage and then suctioned dry. Good hemostasis was appreciated. Posterior capsulotomy was repaired using #5 Ethibond. Piriformis tendon was reapproximated on the undersurface of the gluteus medius tendon using #5 Ethibond. Two medium drains were placed in the wound bed and brought out through a separate stab incision to be attached to a Hemovac reservoir. The IT band was repaired using interrupted sutures of #1 Vicryl. Subcutaneous tissue was approximated in layers using first #0 Vicryl followed by 2-0 Vicryl. Skin was closed with skin staples. Sterile dressing was applied.   Patient tolerated procedure well. She was transported to the recovery room in stable condition.   ____________________________ Laurice Record. Holley Bouche., MD jph:cms D: 10/25/2011 13:45:21 ET T: 10/25/2011 16:09:39 ET JOB#: [id number]  cc: Jeneen Rinks P. Holley Bouche., MD, <Dictator> Laurice Record Holley Bouche MD ELECTRONICALLY SIGNED 10/26/2011 18:10

## 2014-12-06 NOTE — Discharge Summary (Signed)
PATIENT NAME:  Rebekah Ball, Rebekah Ball MR#:  [id number] DATE OF BIRTH:  April 20, 1921  DATE OF ADMISSION:  10/25/2011 DATE OF DISCHARGE:  10/28/2011  ADMITTING DIAGNOSIS: Degenerative arthrosis of the left hip.   DISCHARGE DIAGNOSIS: Degenerative arthrosis the left knee.   HISTORY: The patient is an 81 year old female who has been followed at [facility] for progression of the left hip discomfort. The patient had reported progressive increase in the left hip and groin pain over the last several months. Her pain was noted to be aggravated with weight-bearing activities. She has had to limit her activity due to the severity of pain and primarily had gone to using a scooter. The patient also reported limitation with her left hip range of motion. She had not seen any significant improvement despite the use of anti-inflammatory medications. The pain had progressed to the point that it was significantly interfering with her activities of daily living. X-rays taken in the clinic showed significant narrowing of the cartilage space with evidence of subchondral sclerosis and subchondral cyst formation. After discussion of the risks and benefits of surgical intervention, the patient expressed her understanding of the risks and benefits and agreed with plans for surgical intervention.   PROCEDURE: Left total hip arthroplasty.   ANESTHESIA: General.  IMPLANTS UTILIZED: DePuy 13.5 mm small statue Prodigy femoral component, 54 mm outer diameter Pinnacle 100 acetabular component, +4 mm 10 degree Pinnacle Marathon polyethylene liner, and a 36 mm M-Spec femoral head with a +5 mm neck length.   HOSPITAL COURSE: The patient tolerated the procedure very well. She had no complications. She was then taken to the PAC-U where she was stabilized and then transferred to the orthopedic floor. She began receiving anticoagulation therapy of Lovenox 30 mg subcutaneous every 12 hours per anesthesia and pharmacy protocol. She was fitted with  TED stockings bilaterally. These were allowed to be removed one hour per eight hour shift. She was also fitted with the AV-I compression foot pumps set at 80 mmHg bilaterally. The patient denied any evidence of any deep venous thromboses of the lower extremities. Calves have been nontender. Negative Homans sign. Her heels were elevated off the bed using rolled towels. There was no tissue breakdown or any tenderness noted to palpation.   The patient denied any chest pain or shortness of breath. Vital signs have been stable. She has been afebrile. Hemodynamically she was stable and no transfusions were given.   Physical therapy was initiated on day one for gait training and transfers. She has done extremely well. She has had no complications. Occupational therapy was also initiated on day one for activities of daily living and assistive devices.   The patient's IV, Foley, and Hemovac were all discontinued on day two along with a dressing change. The wound was free of any drainage or signs of infection. Neurovascular and neurosensory was intact and within normal limits to the lower extremity.   The patient is being discharged to a skilled nursing facility in improved stable condition.   DISCHARGE INSTRUCTIONS:  1. She is to continue with physical therapy for gait training and transfers. Occupational therapy for activities of daily living and assistive devices.  2. Continue TED stockings. These are allowed to be removed one hour per eight hour shift.  3. Incentive spirometry every hour while awake.  4. Encouraged cough and deep breathing every two hours while awake.  5. Elevate heels off the bed using rolled towels.  6. Abduction pillow between legs when in bed.  7.  She has a follow-up appointment on 12/07/2011. She is to call sooner for any complications.  8. She is placed on a sodium restriction diet of 2 grams.   DRUG ALLERGIES: Morphine and tape.   MEDICATIONS:  1. Tylenol ES 500 to 1000 mg  every four hours p.r.n. 2. Celebrex 200 mg twice a day. 3. Tramadol 50 to 100 mg every four hours p.r.n. for pain. 4. Oxycodone 5 to 10 mg every four hours p.r.n. for pain.  5. Senokot-S 1 tablet twice a day. 6. Milk of Magnesia 30 mL twice a day p.r.n.  7. Dulcolax suppositories 10 mg rectally p.r.n.  8. Mylanta DS 30 mL every six hours p.r.n. 9. Pantoprazole 40 mg twice a day. 10. Fish oil 1 gram daily.  11. Norvasc 5 mg daily.  12. Lovenox 30 mg subcutaneous every 12 hours for 14 days then discontinue and begin taking one 81 mg enteric-coated aspirin per day.   PAST MEDICAL HISTORY:  1. Cataracts.  2. Chickenpox. 3. Recurrent dislocation of the right hip.  4. Gastrointestinal bleed in September 2011. ____________________________ Vance Peper, PA jrw:slb D: 10/27/2011 08:27:00 ET T: 10/27/2011 08:50:10 ET JOB#: [id number]  cc: Vance Peper, PA, <Dictator> JON WOLFE PA ELECTRONICALLY SIGNED 10/27/2011 12:38

## 2014-12-08 DIAGNOSIS — H4011X2 Primary open-angle glaucoma, moderate stage: Secondary | ICD-10-CM | POA: Diagnosis not present

## 2015-01-13 DIAGNOSIS — H3532 Exudative age-related macular degeneration: Secondary | ICD-10-CM | POA: Diagnosis not present

## 2015-01-27 ENCOUNTER — Encounter: Payer: Medicare Other | Admitting: Internal Medicine

## 2015-02-03 ENCOUNTER — Encounter: Payer: Self-pay | Admitting: Internal Medicine

## 2015-02-03 ENCOUNTER — Ambulatory Visit (INDEPENDENT_AMBULATORY_CARE_PROVIDER_SITE_OTHER): Payer: Medicare Other | Admitting: Internal Medicine

## 2015-02-03 VITALS — BP 140/78 | HR 68 | Temp 98.0°F | Resp 14 | Ht 63.0 in | Wt 157.5 lb

## 2015-02-03 DIAGNOSIS — R5383 Other fatigue: Secondary | ICD-10-CM | POA: Diagnosis not present

## 2015-02-03 DIAGNOSIS — Z66 Do not resuscitate: Secondary | ICD-10-CM

## 2015-02-03 DIAGNOSIS — E559 Vitamin D deficiency, unspecified: Secondary | ICD-10-CM | POA: Diagnosis not present

## 2015-02-03 DIAGNOSIS — D500 Iron deficiency anemia secondary to blood loss (chronic): Secondary | ICD-10-CM

## 2015-02-03 DIAGNOSIS — I10 Essential (primary) hypertension: Secondary | ICD-10-CM | POA: Diagnosis not present

## 2015-02-03 DIAGNOSIS — Z0000 Encounter for general adult medical examination without abnormal findings: Secondary | ICD-10-CM

## 2015-02-03 LAB — COMPREHENSIVE METABOLIC PANEL
ALT: 10 U/L (ref 0–35)
AST: 17 U/L (ref 0–37)
Albumin: 4 g/dL (ref 3.5–5.2)
Alkaline Phosphatase: 57 U/L (ref 39–117)
BILIRUBIN TOTAL: 0.3 mg/dL (ref 0.2–1.2)
BUN: 14 mg/dL (ref 6–23)
CALCIUM: 9.5 mg/dL (ref 8.4–10.5)
CO2: 25 meq/L (ref 19–32)
CREATININE: 0.98 mg/dL (ref 0.40–1.20)
Chloride: 106 mEq/L (ref 96–112)
GFR: 56.44 mL/min — AB (ref >60.00)
GLUCOSE: 82 mg/dL (ref 70–99)
POTASSIUM: 4.5 meq/L (ref 3.5–5.1)
Sodium: 138 mEq/L (ref 135–145)
Total Protein: 7.5 g/dL (ref 6.0–8.3)

## 2015-02-03 LAB — CBC WITH DIFFERENTIAL/PLATELET
BASOS ABS: 0 10*3/uL (ref 0.0–0.1)
BASOS PCT: 0.6 % (ref 0.0–3.0)
EOS ABS: 0.1 10*3/uL (ref 0.0–0.7)
EOS PCT: 1.4 % (ref 0.0–5.0)
HEMATOCRIT: 44.2 % (ref 36.0–46.0)
HEMOGLOBIN: 14.5 g/dL (ref 12.0–15.0)
LYMPHS ABS: 2.5 10*3/uL (ref 0.7–4.0)
Lymphocytes Relative: 40.6 % (ref 12.0–46.0)
MCHC: 32.7 g/dL (ref 30.0–36.0)
MCV: 92.3 fl (ref 78.0–100.0)
MONOS PCT: 11.6 % (ref 3.0–12.0)
Monocytes Absolute: 0.7 10*3/uL (ref 0.1–1.0)
Neutro Abs: 2.9 10*3/uL (ref 1.4–7.7)
Neutrophils Relative %: 45.8 % (ref 43.0–77.0)
PLATELETS: 207 10*3/uL (ref 150.0–400.0)
RBC: 4.79 Mil/uL (ref 3.87–5.11)
RDW: 14 % (ref 11.5–15.5)
WBC: 6.3 10*3/uL (ref 4.0–10.5)

## 2015-02-03 LAB — VITAMIN D 25 HYDROXY (VIT D DEFICIENCY, FRACTURES): VITD: 22.94 ng/mL — ABNORMAL LOW (ref 30.00–100.00)

## 2015-02-03 LAB — TSH: TSH: 3.37 u[IU]/mL (ref 0.35–4.50)

## 2015-02-03 NOTE — Progress Notes (Signed)
xPatient ID: Rebekah Ball, female    DOB: 1920/10/13  Age: 81 y.o. MRN: [id number]  The patient is here for annual Medicare wellness examination and management of other chronic and acute problems.  Plays bridge 4 days per week Does not exercise due to OA DJD knees and hips.  And prior fall   Lives at [facility] Diminished appetite  Has declined PT   Had a fall 6 weeks ago,  wnt to ER and was admitted overnigh with scalp /forehed laceration .    The risk factors are reflected in the social history.  The roster of all physicians providing medical care to patient - is listed in the Snapshot section of the chart.  Activities of daily living:  The patient is 100% independent in all ADLs: dressing, toileting, feeding as well as independent mobility  Home safety : The patient has smoke detectors in the home. They wear seatbelts.  There are no firearms at home. There is no violence in the home.   There is no risks for hepatitis, STDs or HIV. There is no   history of blood transfusion. They have no travel history to infectious disease endemic areas of the world.  The patient has seen their dentist in the last six month. They have seen their eye doctor in the last year. They admit to slight hearing difficulty with regard to whispered voices and some television programs.  They have deferred audiologic testing in the last year.  They do not  have excessive sun exposure. Discussed the need for sun protection: hats, long sleeves and use of sunscreen if there is significant sun exposure.   Diet: the importance of a healthy diet is discussed. They do have a healthy diet.  The benefits of regular aerobic exercise were discussed. She walks 4 times per week ,  20 minutes.   Depression screen: there are no signs or vegative symptoms of depression- irritability, change in appetite, anhedonia, sadness/tearfullness.  Cognitive assessment: the patient manages all their financial and personal affairs and is  actively engaged. They could relate day,date,year and events; recalled 2/3 objects at 3 minutes; performed clock-face test normally.  The following portions of the patient's history were reviewed and updated as appropriate: allergies, current medications, past family history, past medical history,  past surgical history, past social history  and problem list.  Visual acuity was not assessed per patient preference since she has regular follow up with her ophthalmologist. Hearing and body mass index were assessed and reviewed.   During the course of the visit the patient was educated and counseled about appropriate screening and preventive services including : fall prevention , diabetes screening, nutrition counseling, colorectal cancer screening, and recommended immunizations.    CC: The primary encounter diagnosis was Do not resuscitate. Diagnoses of Other fatigue, Essential hypertension, Vitamin D deficiency, Medicare annual wellness visit, subsequent, and Iron deficiency anemia due to chronic blood loss were also pertinent to this visit.  History Rebekah Ball has a past medical history of Anemia.   She has past surgical history that includes Joint replacement.   Her family history is not on file.She reports that she has quit smoking. She does not have any smokeless tobacco history on file. She reports that she drinks alcohol. Her drug history is not on file.  Outpatient Prescriptions Prior to Visit  Medication Sig Dispense Refill  . amLODipine (NORVASC) 10 MG tablet Take 1 tablet (10 mg total) by mouth daily. 90 tablet 1  . omeprazole (PRILOSEC) 40 MG  capsule TAKE ONE CAPSULE DAILY USUALLY 30 MINUTES BEFORE BREAKFAST 30 capsule 5   No facility-administered medications prior to visit.    Review of Systems   Patient denies headache, fevers, malaise, unintentional weight loss, skin rash, eye pain, sinus congestion and sinus pain, sore throat, dysphagia,  hemoptysis , cough, dyspnea, wheezing,  chest pain, palpitations, orthopnea, edema, abdominal pain, nausea, melena, diarrhea, constipation, flank pain, dysuria, hematuria, urinary  Frequency, nocturia, numbness, tingling, seizures,  Focal weakness, Loss of consciousness,  Tremor, insomnia, depression, anxiety, and suicidal ideation.      Objective:  BP 140/78 mmHg  Pulse 68  Temp(Src) 98 F (36.7 C) (Oral)  Resp 14  Ht 5\' 3"  (1.6 m)  Wt 157 lb 8 oz (71.442 kg)  BMI 27.91 kg/m2  SpO2 98%  Physical Exam  General appearance: alert, cooperative and appears stated age Head: Normocephalic, without obvious abnormality, atraumatic Eyes: conjunctivae/corneas clear. PERRL, EOM's intact. Fundi benign. Ears: normal TM's and external ear canals both ears Nose: Nares normal. Septum midline. Mucosa normal. No drainage or sinus tenderness. Throat: lips, mucosa, and tongue normal; teeth and gums normal Neck: no adenopathy, no carotid bruit, no JVD, supple, symmetrical, trachea midline and thyroid not enlarged, symmetric, no tenderness/mass/nodules Lungs: clear to auscultation bilaterally Breasts: normal appearance, no masses or tenderness Heart: regular rate and rhythm, S1, S2 normal, no murmur, click, rub or gallop Abdomen: soft, non-tender; bowel sounds normal; no masses,  no organomegaly Extremities: extremities normal, atraumatic, no cyanosis or edema Pulses: 2+ and symmetric Skin: Skin color, texture, turgor normal. No rashes or lesions Neurologic: Alert and oriented X 3, normal strength and tone. Normal symmetric reflexes. Normal coordination and gait.    Assessment & Plan:   Problem List Items Addressed This Visit    Anemia    Diagnosed in June 2015, due to diverticular bleed, presumed.  Her anemia has resolved.   Lab Results  Component Value Date   WBC 6.3 02/03/2015   HGB 14.5 02/03/2015   HCT 44.2 02/03/2015   MCV 92.3 02/03/2015   PLT 207.0 02/03/2015           Hypertension    Well controlled on current  regimen. Renal function stable, no changes today.  Lab Results  Component Value Date   CREATININE 0.98 02/03/2015   Lab Results  Component Value Date   NA 138 02/03/2015   K 4.5 02/03/2015   CL 106 02/03/2015   CO2 25 02/03/2015           Relevant Orders   Comprehensive metabolic panel (Completed)   Medicare annual wellness visit, subsequent   Do not resuscitate - Primary    Other Visit Diagnoses    Other fatigue        Relevant Orders    CBC with Differential/Platelet (Completed)    TSH (Completed)    Vitamin D deficiency        Relevant Orders    Vit D  25 hydroxy (rtn osteoporosis monitoring) (Completed)       I am having Ms. Leaton maintain her amLODipine, omeprazole, and LUMIGAN.  Meds ordered this encounter  Medications  . LUMIGAN 0.01 % SOLN    Sig: Place 1 drop into the left eye at bedtime.    There are no discontinued medications.  Follow-up: Return in about 6 months (around 08/05/2015).   Crecencio Mc, MD

## 2015-02-03 NOTE — Patient Instructions (Signed)
hHealth Maintenance Adopting a healthy lifestyle and getting preventive care can go a long way to promote health and wellness. Talk with your health care provider about what schedule of regular examinations is right for you. This is a good chance for you to check in with your provider about disease prevention and staying healthy. In between checkups, there are plenty of things you can do on your own. Experts have done a lot of research about which lifestyle changes and preventive measures are most likely to keep you healthy. Ask your health care provider for more information. WEIGHT AND DIET  Eat a healthy diet  Be sure to include plenty of vegetables, fruits, low-fat dairy products, and lean protein.  Do not eat a lot of foods high in solid fats, added sugars, or salt.  Get regular exercise. This is one of the most important things you can do for your health.  Most adults should exercise for at least 150 minutes each week. The exercise should increase your heart rate and make you sweat (moderate-intensity exercise).  Most adults should also do strengthening exercises at least twice a week. This is in addition to the moderate-intensity exercise.  Maintain a healthy weight  Body mass index (BMI) is a measurement that can be used to identify possible weight problems. It estimates body fat based on height and weight. Your health care provider can help determine your BMI and help you achieve or maintain a healthy weight.  For females 60 years of age and older:   A BMI below 18.5 is considered underweight.  A BMI of 18.5 to 24.9 is normal.  A BMI of 25 to 29.9 is considered overweight.  A BMI of 30 and above is considered obese.  Watch levels of cholesterol and blood lipids  You should start having your blood tested for lipids and cholesterol at 79 years of age, then have this test every 5 years.  You may need to have your cholesterol levels checked more often if:  Your lipid or  cholesterol levels are high.  You are older than 79 years of age.  You are at high risk for heart disease.  CANCER SCREENING   Lung Cancer  Lung cancer screening is recommended for adults 67-23 years old who are at high risk for lung cancer because of a history of smoking.  A yearly low-dose CT scan of the lungs is recommended for people who:  Currently smoke.  Have quit within the past 15 years.  Have at least a 30-pack-year history of smoking. A pack year is smoking an average of one pack of cigarettes a day for 1 year.  Yearly screening should continue until it has been 15 years since you quit.  Yearly screening should stop if you develop a health problem that would prevent you from having lung cancer treatment.  Breast Cancer  Practice breast self-awareness. This means understanding how your breasts normally appear and feel.  It also means doing regular breast self-exams. Let your health care provider know about any changes, no matter how small.  If you are in your 20s or 30s, you should have a clinical breast exam (CBE) by a health care provider every 1-3 years as part of a regular health exam.  If you are 95 or older, have a CBE every year. Also consider having a breast X-ray (mammogram) every year.  If you have a family history of breast cancer, talk to your health care provider about genetic screening.  If you are  at high risk for breast cancer, talk to your health care provider about having an MRI and a mammogram every year.  Breast cancer gene (BRCA) assessment is recommended for women who have family members with BRCA-related cancers. BRCA-related cancers include:  Breast.  Ovarian.  Tubal.  Peritoneal cancers.  Results of the assessment will determine the need for genetic counseling and BRCA1 and BRCA2 testing. Cervical Cancer Routine pelvic examinations to screen for cervical cancer are no longer recommended for nonpregnant women who are considered low  risk for cancer of the pelvic organs (ovaries, uterus, and vagina) and who do not have symptoms. A pelvic examination may be necessary if you have symptoms including those associated with pelvic infections. Ask your health care provider if a screening pelvic exam is right for you.   The Pap test is the screening test for cervical cancer for women who are considered at risk.  If you had a hysterectomy for a problem that was not cancer or a condition that could lead to cancer, then you no longer need Pap tests.  If you are older than 65 years, and you have had normal Pap tests for the past 10 years, you no longer need to have Pap tests.  If you have had past treatment for cervical cancer or a condition that could lead to cancer, you need Pap tests and screening for cancer for at least 20 years after your treatment.  If you no longer get a Pap test, assess your risk factors if they change (such as having a new sexual partner). This can affect whether you should start being screened again.  Some women have medical problems that increase their chance of getting cervical cancer. If this is the case for you, your health care provider may recommend more frequent screening and Pap tests.  The human papillomavirus (HPV) test is another test that may be used for cervical cancer screening. The HPV test looks for the virus that can cause cell changes in the cervix. The cells collected during the Pap test can be tested for HPV.  The HPV test can be used to screen women 30 years of age and older. Getting tested for HPV can extend the interval between normal Pap tests from three to five years.  An HPV test also should be used to screen women of any age who have unclear Pap test results.  After 79 years of age, women should have HPV testing as often as Pap tests.  Colorectal Cancer  This type of cancer can be detected and often prevented.  Routine colorectal cancer screening usually begins at 79 years of  age and continues through 79 years of age.  Your health care provider may recommend screening at an earlier age if you have risk factors for colon cancer.  Your health care provider may also recommend using home test kits to check for hidden blood in the stool.  A small camera at the end of a tube can be used to examine your colon directly (sigmoidoscopy or colonoscopy). This is done to check for the earliest forms of colorectal cancer.  Routine screening usually begins at age 50.  Direct examination of the colon should be repeated every 5-10 years through 79 years of age. However, you may need to be screened more often if early forms of precancerous polyps or small growths are found. Skin Cancer  Check your skin from head to toe regularly.  Tell your health care provider about any new moles or changes in   moles, especially if there is a change in a mole's shape or color.  Also tell your health care provider if you have a mole that is larger than the size of a pencil eraser.  Always use sunscreen. Apply sunscreen liberally and repeatedly throughout the day.  Protect yourself by wearing long sleeves, pants, a wide-brimmed hat, and sunglasses whenever you are outside. HEART DISEASE, DIABETES, AND HIGH BLOOD PRESSURE   Have your blood pressure checked at least every 1-2 years. High blood pressure causes heart disease and increases the risk of stroke.  If you are between 75 years and 42 years old, ask your health care provider if you should take aspirin to prevent strokes.  Have regular diabetes screenings. This involves taking a blood sample to check your fasting blood sugar level.  If you are at a normal weight and have a low risk for diabetes, have this test once every three years after 79 years of age.  If you are overweight and have a high risk for diabetes, consider being tested at a younger age or more often. PREVENTING INFECTION  Hepatitis B  If you have a higher risk for  hepatitis B, you should be screened for this virus. You are considered at high risk for hepatitis B if:  You were born in a country where hepatitis B is common. Ask your health care provider which countries are considered high risk.  Your parents were born in a high-risk country, and you have not been immunized against hepatitis B (hepatitis B vaccine).  You have HIV or AIDS.  You use needles to inject street drugs.  You live with someone who has hepatitis B.  You have had sex with someone who has hepatitis B.  You get hemodialysis treatment.  You take certain medicines for conditions, including cancer, organ transplantation, and autoimmune conditions. Hepatitis C  Blood testing is recommended for:  Everyone born from [birth year] through 1963.  Anyone with known risk factors for hepatitis C. Sexually transmitted infections (STIs)  You should be screened for sexually transmitted infections (STIs) including gonorrhea and chlamydia if:  You are sexually active and are younger than 79 years of age.  You are older than 79 years of age and your health care provider tells you that you are at risk for this type of infection.  Your sexual activity has changed since you were last screened and you are at an increased risk for chlamydia or gonorrhea. Ask your health care provider if you are at risk.  If you do not have HIV, but are at risk, it may be recommended that you take a prescription medicine daily to prevent HIV infection. This is called pre-exposure prophylaxis (PrEP). You are considered at risk if:  You are sexually active and do not regularly use condoms or know the HIV status of your partner(s).  You take drugs by injection.  You are sexually active with a partner who has HIV. Talk with your health care provider about whether you are at high risk of being infected with HIV. If you choose to begin PrEP, you should first be tested for HIV. You should then be tested every 3 months for  as long as you are taking PrEP.  PREGNANCY   If you are premenopausal and you may become pregnant, ask your health care provider about preconception counseling.  If you may become pregnant, take 400 to 800 micrograms (mcg) of folic acid every day.  If you want to prevent pregnancy, talk to your  health care provider about birth control (contraception). OSTEOPOROSIS AND MENOPAUSE   Osteoporosis is a disease in which the bones lose minerals and strength with aging. This can result in serious bone fractures. Your risk for osteoporosis can be identified using a bone density scan.  If you are 43 years of age or older, or if you are at risk for osteoporosis and fractures, ask your health care provider if you should be screened.  Ask your health care provider whether you should take a calcium or vitamin D supplement to lower your risk for osteoporosis.  Menopause may have certain physical symptoms and risks.  Hormone replacement therapy may reduce some of these symptoms and risks. Talk to your health care provider about whether hormone replacement therapy is right for you.  HOME CARE INSTRUCTIONS   Schedule regular health, dental, and eye exams.  Stay current with your immunizations.   Do not use any tobacco products including cigarettes, chewing tobacco, or electronic cigarettes.  If you are pregnant, do not drink alcohol.  If you are breastfeeding, limit how much and how often you drink alcohol.  Limit alcohol intake to no more than 1 drink per day for nonpregnant women. One drink equals 12 ounces of beer, 5 ounces of wine, or 1 ounces of hard liquor.  Do not use street drugs.  Do not share needles.  Ask your health care provider for help if you need support or information about quitting drugs.  Tell your health care provider if you often feel depressed.  Tell your health care provider if you have ever been abused or do not feel safe at home. Document Released: 02/13/2011  Document Revised: 12/15/2013 Document Reviewed: 07/02/2013 [facility] Patient Information 2015 [place], [place]. This information is not intended to replace advice given to you by your health care provider. Make sure you discuss any questions you have with your health care provider.

## 2015-02-07 ENCOUNTER — Encounter: Payer: Self-pay | Admitting: Internal Medicine

## 2015-02-07 DIAGNOSIS — Z0000 Encounter for general adult medical examination without abnormal findings: Secondary | ICD-10-CM | POA: Insufficient documentation

## 2015-02-07 MED ORDER — ERGOCALCIFEROL 1.25 MG (50000 UT) PO CAPS: 50000.0000 [IU] | ORAL_CAPSULE | ORAL | Status: DC

## 2015-02-07 NOTE — Assessment & Plan Note (Signed)
Well controlled on current regimen. Renal function stable, no changes today.  Lab Results  Component Value Date   CREATININE 0.98 02/03/2015   Lab Results  Component Value Date   NA 138 02/03/2015   K 4.5 02/03/2015   CL 106 02/03/2015   CO2 25 02/03/2015

## 2015-02-07 NOTE — Assessment & Plan Note (Signed)
Diagnosed in June 2015, due to diverticular bleed, presumed.  Her anemia has resolved.   Lab Results  Component Value Date   WBC 6.3 02/03/2015   HGB 14.5 02/03/2015   HCT 44.2 02/03/2015   MCV 92.3 02/03/2015   PLT 207.0 02/03/2015

## 2015-02-07 NOTE — Addendum Note (Signed)
Addended by: Crecencio Mc on: 02/07/2015 07:29 PM   Modules accepted: Orders, SmartSet

## 2015-02-08 ENCOUNTER — Encounter: Payer: Self-pay | Admitting: *Deleted

## 2015-02-12 IMAGING — CT CT CERVICAL SPINE WITHOUT CONTRAST
3 of 5 series · 13 of 34 positions shown, 16 images · non-contrast
Comparison: None.

CLINICAL DATA: Fall.  Laceration to right side of face.

EXAM:
CT HEAD WITHOUT CONTRAST
CT CERVICAL SPINE WITHOUT CONTRAST
TECHNIQUE: Multidetector CT imaging of the head and cervical spine was
performed following the standard protocol without intravenous
contrast. Multiplanar CT image reconstructions of the cervical spine
were also generated.

[Series 6: sag bone · sagittal · 0.23mm/px · 5 of 76 slices shown, 6 images]
[im 26/76  bone]
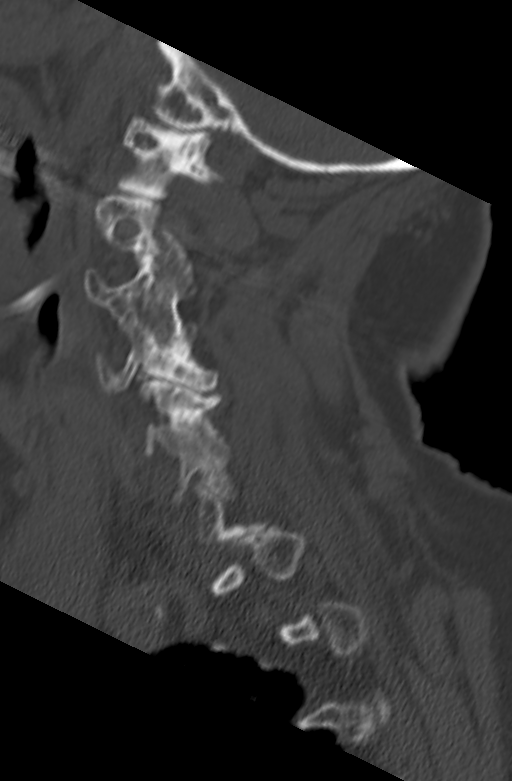
[im 32/76  bone]
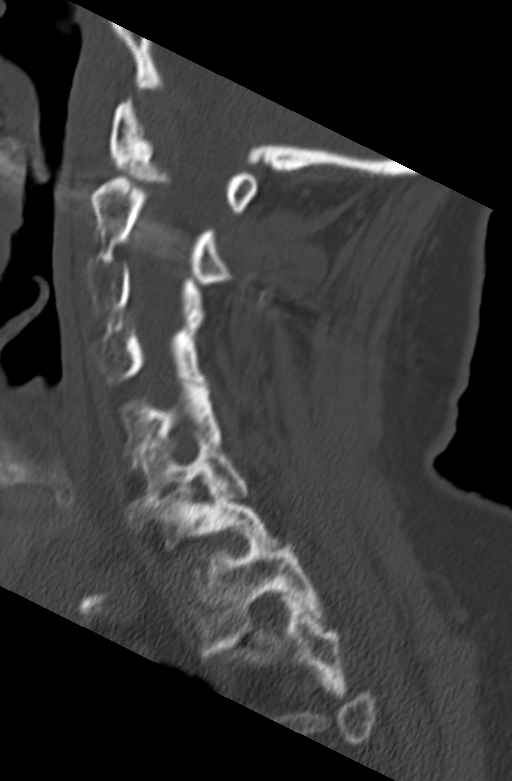
[im 38/76  soft-tissue]
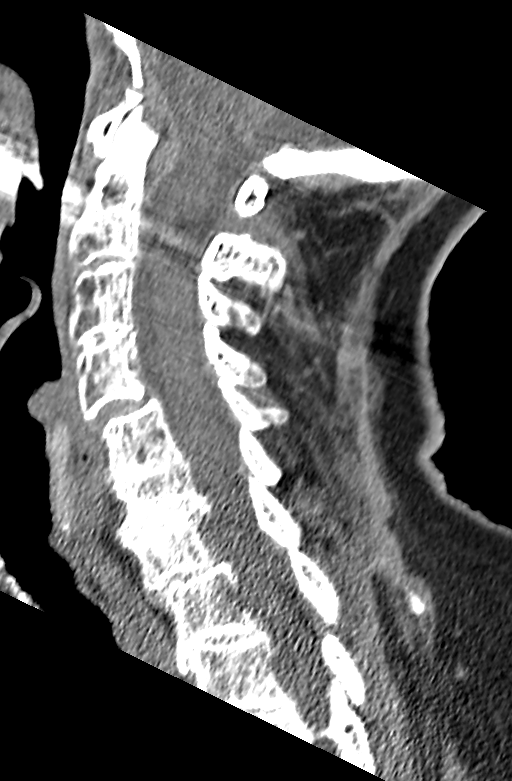
[im 38/76  bone]
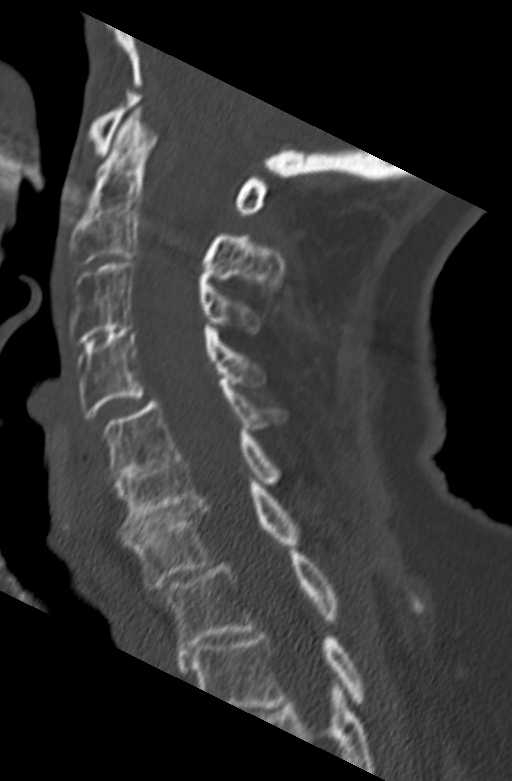
[im 44/76  bone]
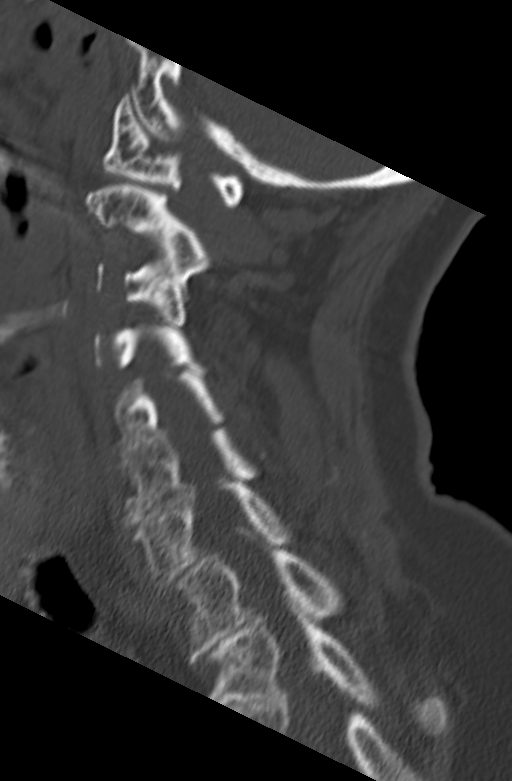
[im 51/76  bone]
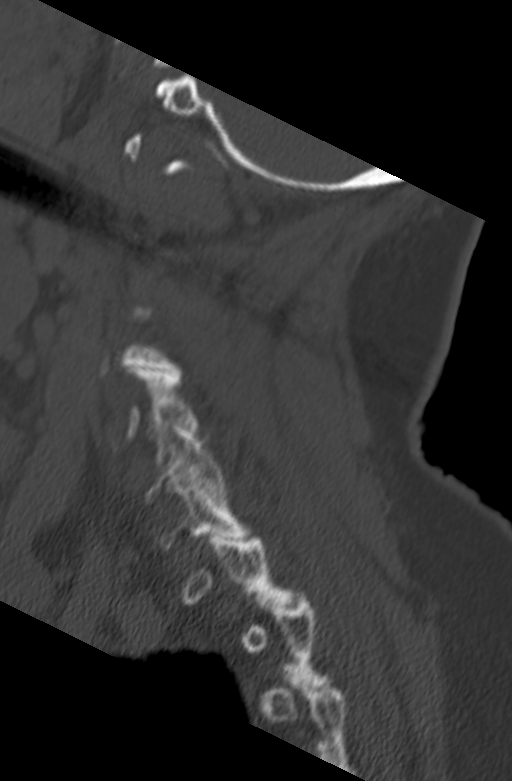

[Series 7: cor bone · coronal · 0.29mm/px · 3 of 76 slices shown]
[im 25/76  bone]
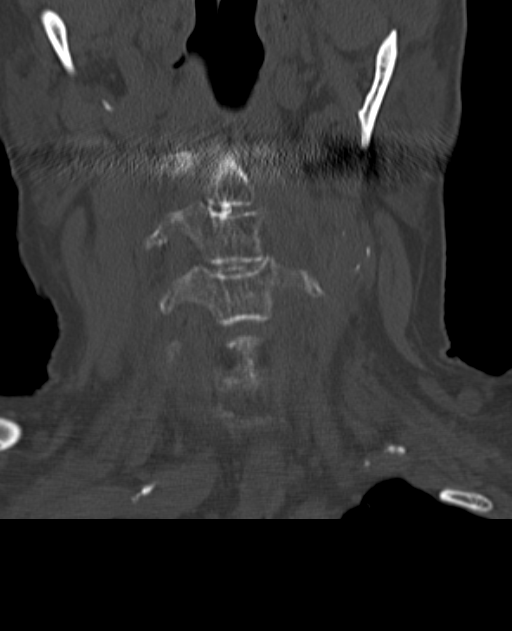
[im 34/76  bone]
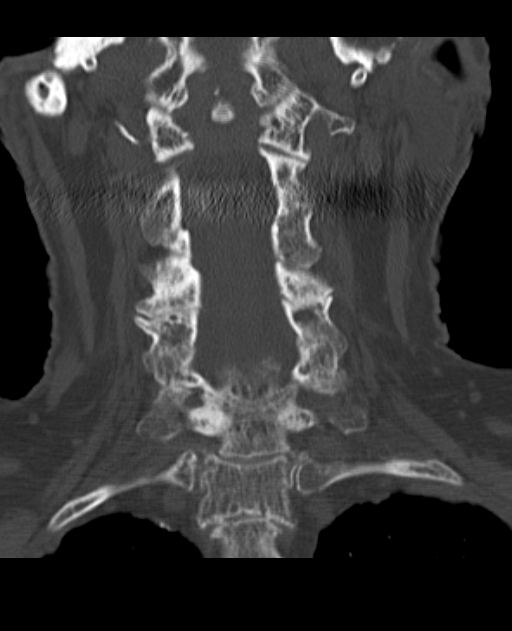
[im 43/76  bone]
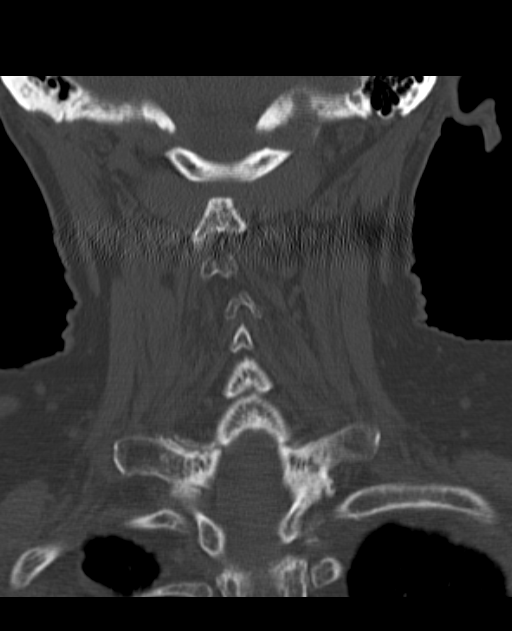

[Series 8: orthogonal axials · axial · 0.29mm/px · z∈[-295,-187]mm · 5 of 100 slices shown, 7 images]
[im 17/100  soft-tissue]
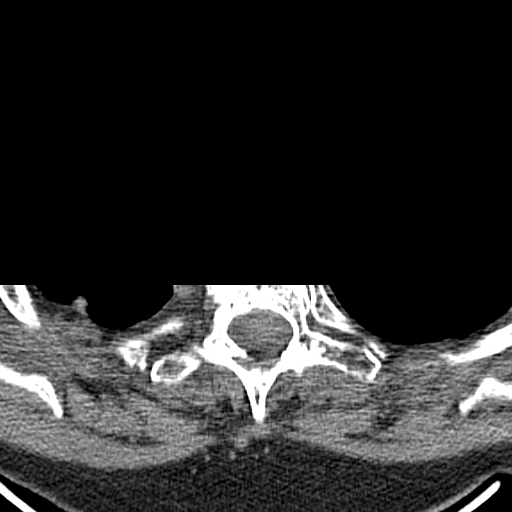
[im 17/100  bone]
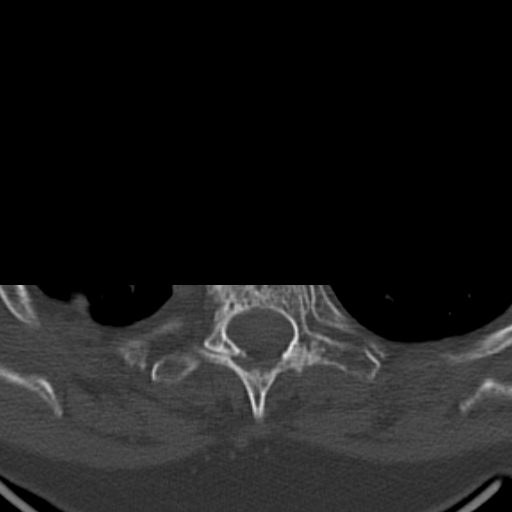
[im 34/100  bone]
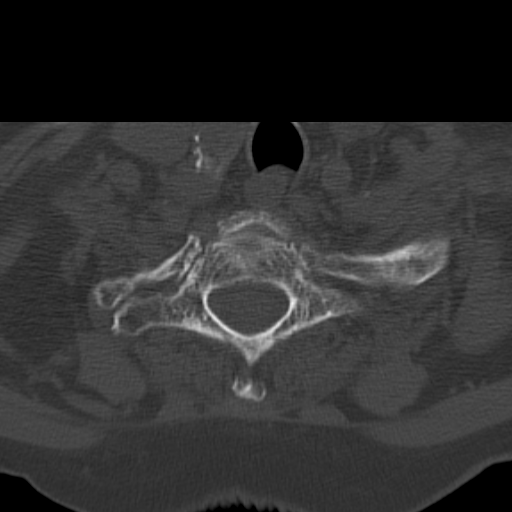
[im 50/100  bone]
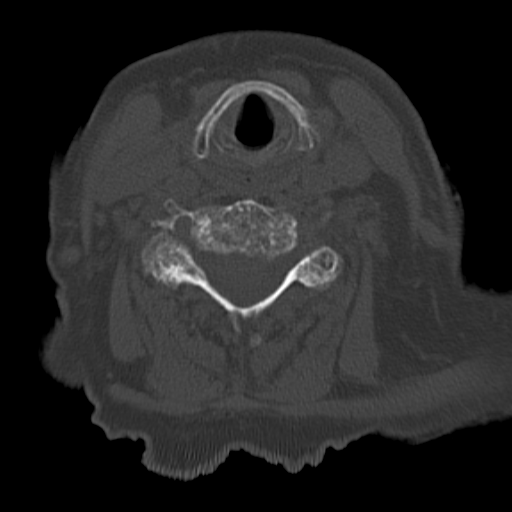
[im 67/100  bone]
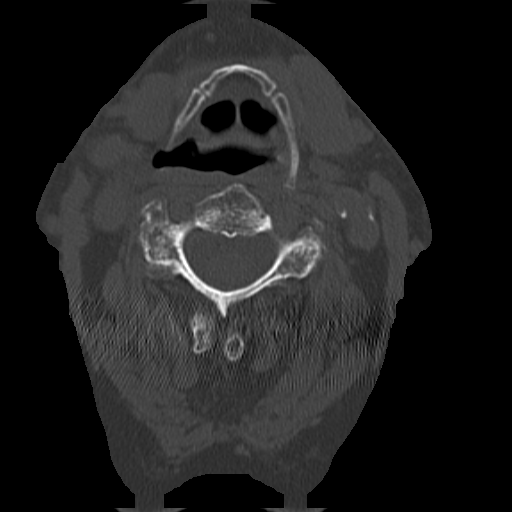
[im 83/100  soft-tissue]
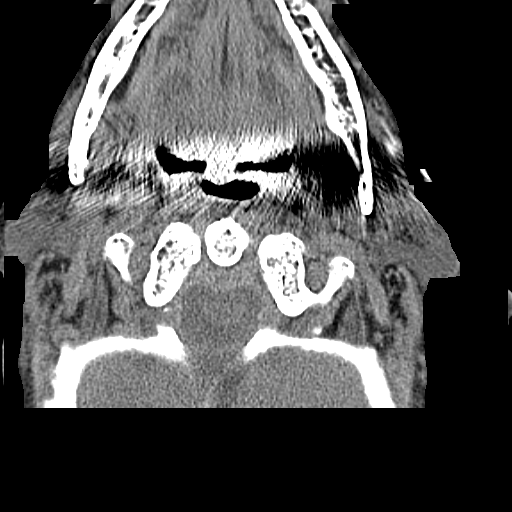
[im 83/100  bone]
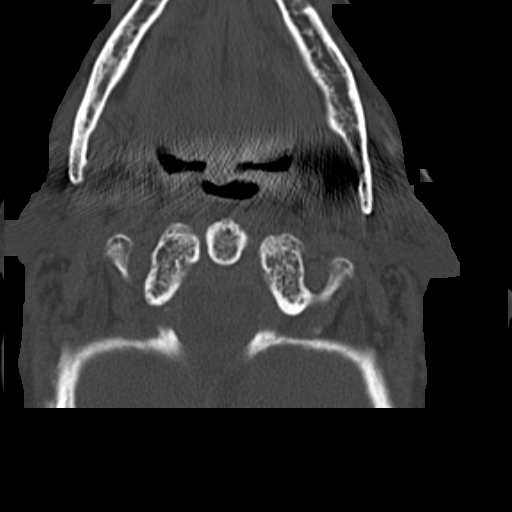

[13 of 34 positions shown; findings below may reference images not displayed]

FINDINGS: CT HEAD FINDINGS

Mild chronic microvascular changes throughout the deep white matter.
Old lacunar infarct in the right periventricular white matter. Old
bilateral basal ganglia lacunar infarcts. No acute intracranial
abnormality. Specifically, no hemorrhage, hydrocephalus, mass
lesion, acute infarction, or significant intracranial injury. No
acute calvarial abnormality. Visualized paranasal sinuses and
mastoids clear. Orbital soft tissues unremarkable.

CT CERVICAL SPINE FINDINGS

Diffuse degenerative disc and facet disease. Slight anterolisthesis
of C4 on C5 related to facet disease. Slight anterolisthesis of C7
on T1 related to facet disease. No fracture. No epidural or
paraspinal hematoma.
IMPRESSION: No acute intracranial abnormality.

Advanced degenerative changes in the cervical spine without acute
bony abnormality.

## 2015-02-12 IMAGING — CR DG CHEST 2V
1 series · 2 of 2 positions shown · non-contrast
Comparison: 07/06/2008

CLINICAL DATA: Upper chest pain

EXAM:
CHEST  2 VIEW

[Series 2: w chest lat · 0.14mm/px · 2 of 2 slices shown]
[im 1/2]
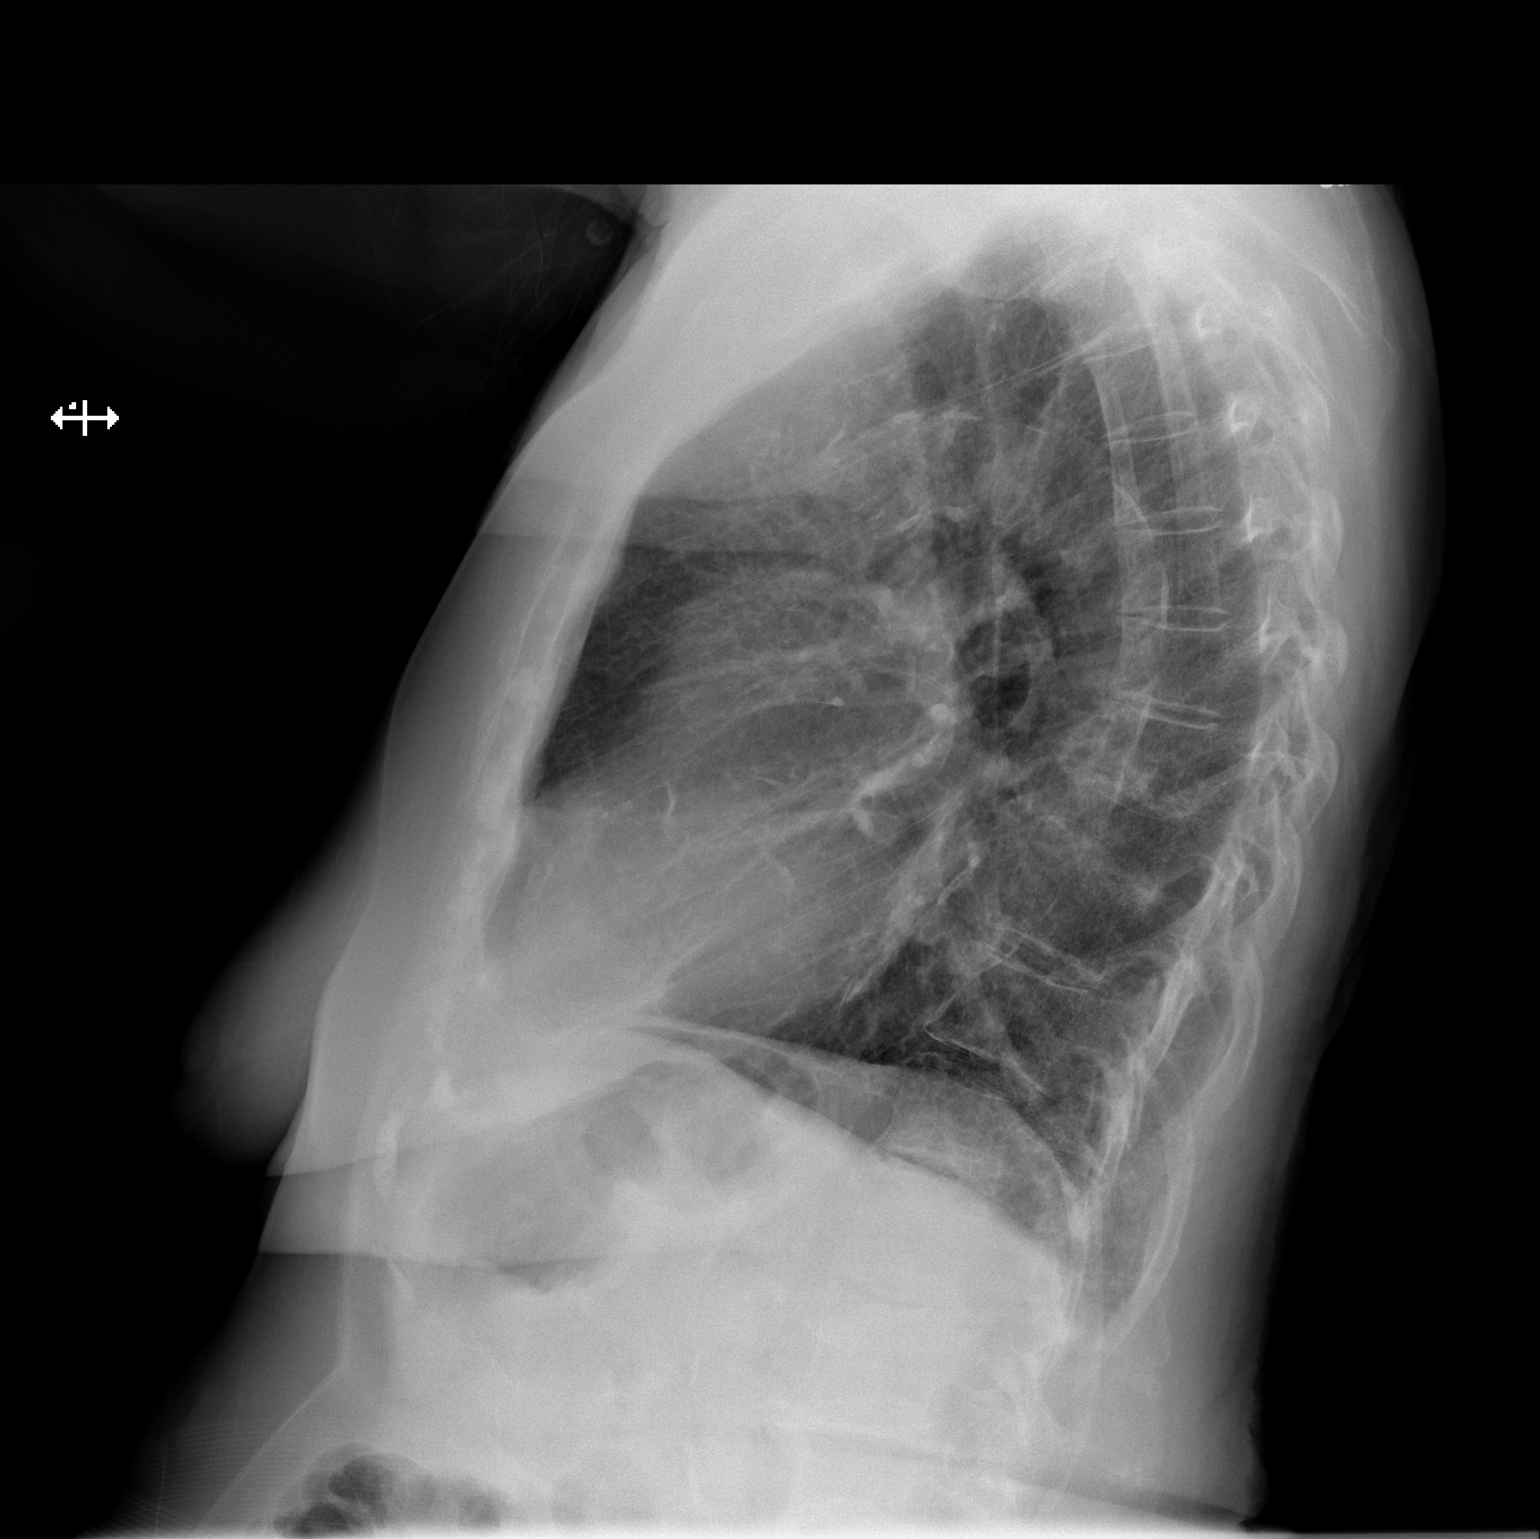
[im 2/2]
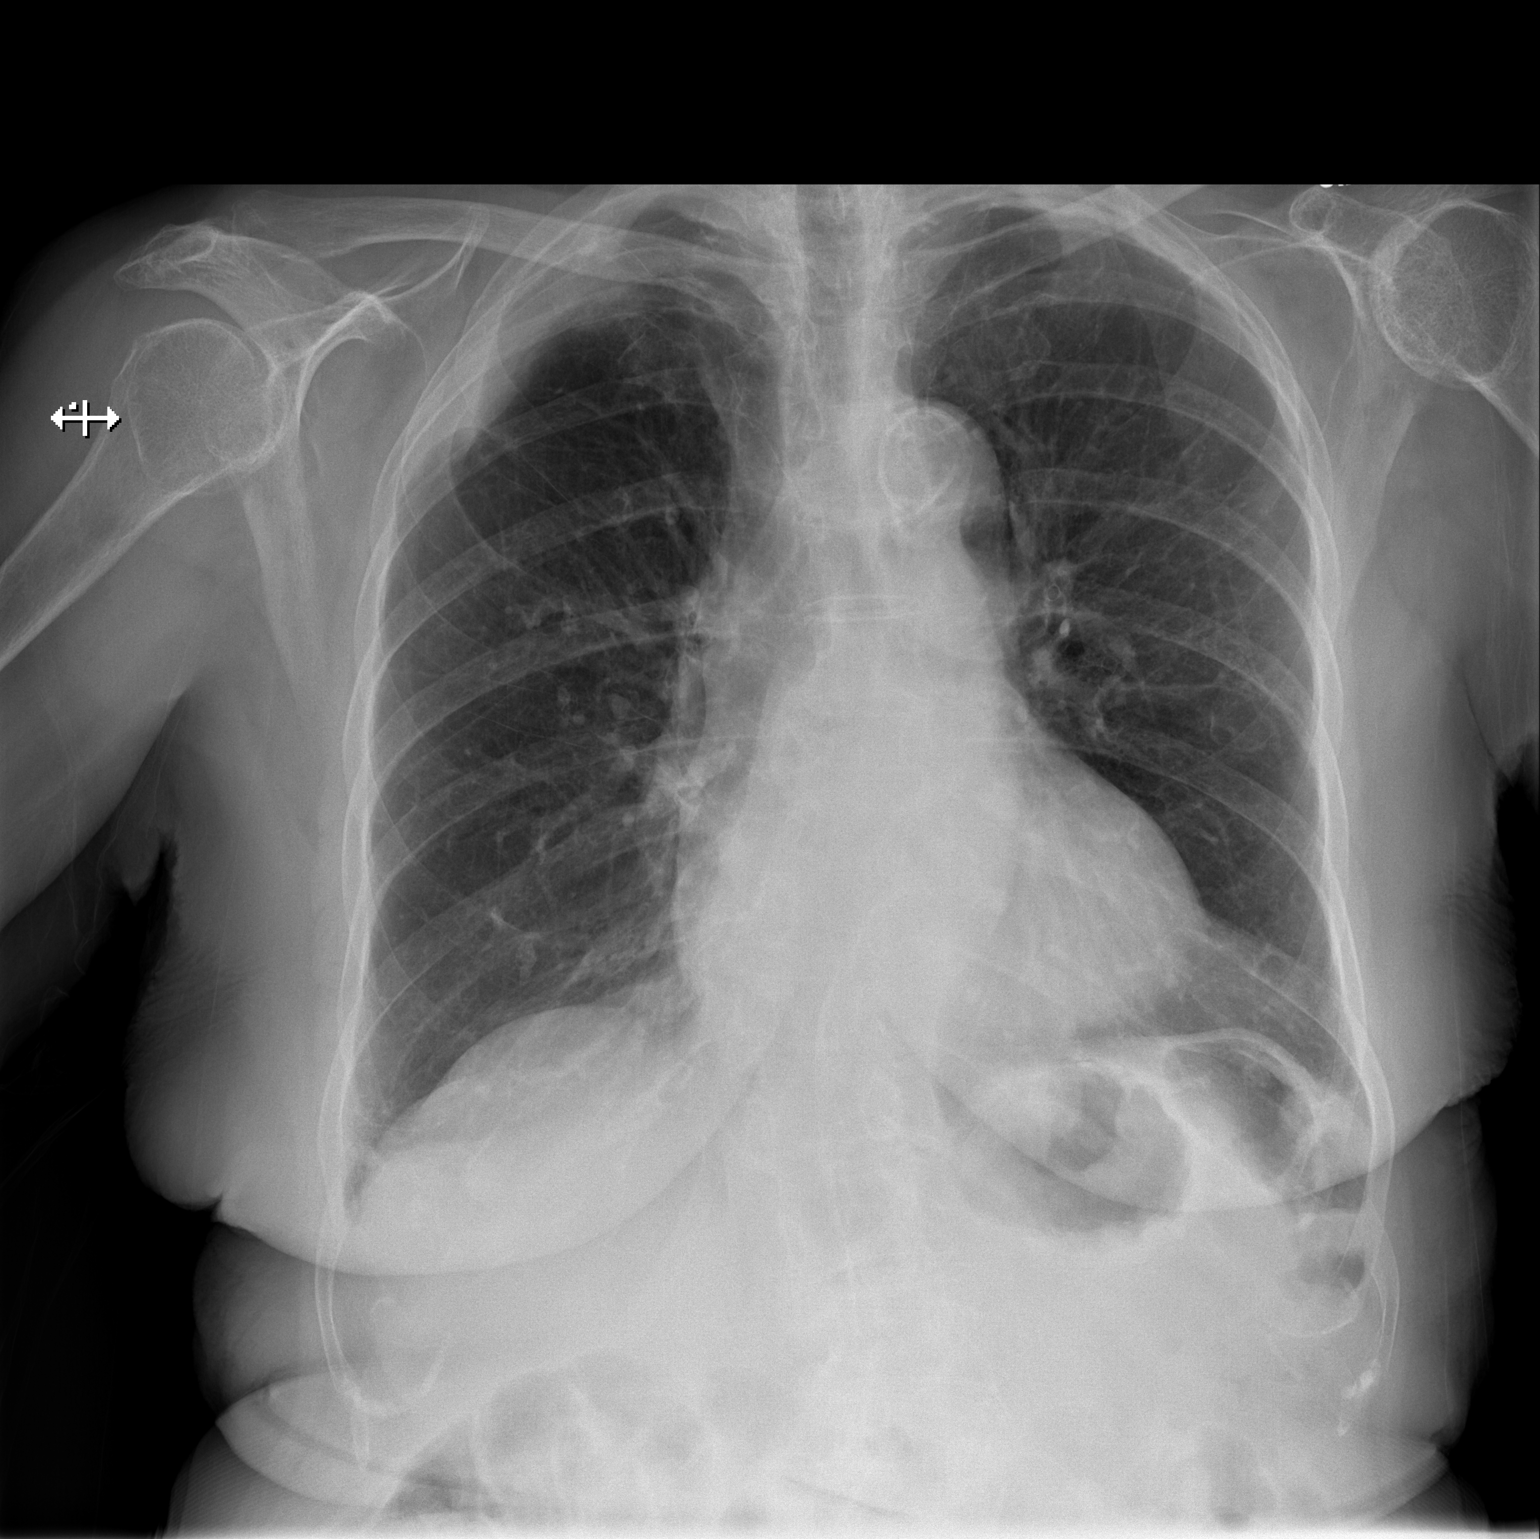

[2 of 2 positions shown; findings below may reference images not displayed]

FINDINGS: Cardiac shadow is stable. The lungs are well aerated bilaterally.
Persistent pleural thickening is noted in the right lung apex
laterally. No focal infiltrate or sizable effusion is seen.
Degenerative changes of the thoracic spine are noted.
IMPRESSION: Chronic changes without acute abnormality.

## 2015-02-24 DIAGNOSIS — H3532 Exudative age-related macular degeneration: Secondary | ICD-10-CM | POA: Diagnosis not present

## 2015-03-10 ENCOUNTER — Other Ambulatory Visit: Payer: Self-pay | Admitting: Internal Medicine

## 2015-03-12 DIAGNOSIS — H3532 Exudative age-related macular degeneration: Secondary | ICD-10-CM | POA: Diagnosis not present

## 2015-03-24 DIAGNOSIS — H3532 Exudative age-related macular degeneration: Secondary | ICD-10-CM | POA: Diagnosis not present

## 2015-04-26 DIAGNOSIS — H3532 Exudative age-related macular degeneration: Secondary | ICD-10-CM | POA: Diagnosis not present

## 2015-05-26 DIAGNOSIS — H353221 Exudative age-related macular degeneration, left eye, with active choroidal neovascularization: Secondary | ICD-10-CM | POA: Diagnosis not present

## 2015-06-01 DIAGNOSIS — Z23 Encounter for immunization: Secondary | ICD-10-CM | POA: Diagnosis not present

## 2015-06-08 DIAGNOSIS — H401122 Primary open-angle glaucoma, left eye, moderate stage: Secondary | ICD-10-CM | POA: Diagnosis not present

## 2015-06-09 DIAGNOSIS — H6123 Impacted cerumen, bilateral: Secondary | ICD-10-CM | POA: Diagnosis not present

## 2015-06-09 DIAGNOSIS — H903 Sensorineural hearing loss, bilateral: Secondary | ICD-10-CM | POA: Diagnosis not present

## 2015-06-22 DIAGNOSIS — H401122 Primary open-angle glaucoma, left eye, moderate stage: Secondary | ICD-10-CM | POA: Diagnosis not present

## 2015-06-23 DIAGNOSIS — H353221 Exudative age-related macular degeneration, left eye, with active choroidal neovascularization: Secondary | ICD-10-CM | POA: Diagnosis not present

## 2015-07-19 DIAGNOSIS — H353221 Exudative age-related macular degeneration, left eye, with active choroidal neovascularization: Secondary | ICD-10-CM | POA: Diagnosis not present

## 2015-08-04 ENCOUNTER — Ambulatory Visit: Payer: Medicare Other | Admitting: Internal Medicine

## 2015-08-18 ENCOUNTER — Ambulatory Visit (INDEPENDENT_AMBULATORY_CARE_PROVIDER_SITE_OTHER): Payer: Self-pay | Admitting: Internal Medicine

## 2015-08-18 DIAGNOSIS — I10 Essential (primary) hypertension: Secondary | ICD-10-CM

## 2015-08-18 NOTE — Assessment & Plan Note (Signed)
Patient failed to keep scheduled appointment and will be charged a no show fee.   

## 2015-08-18 NOTE — Progress Notes (Signed)
Patient failed to keep scheduled appointment and will be charged a no show fee.   

## 2015-08-25 DIAGNOSIS — H353221 Exudative age-related macular degeneration, left eye, with active choroidal neovascularization: Secondary | ICD-10-CM | POA: Diagnosis not present

## 2015-10-18 DIAGNOSIS — H35352 Cystoid macular degeneration, left eye: Secondary | ICD-10-CM | POA: Diagnosis not present

## 2015-10-26 ENCOUNTER — Other Ambulatory Visit: Payer: Self-pay | Admitting: Internal Medicine

## 2015-10-26 NOTE — Telephone Encounter (Signed)
Looks like facility was going to have RN at facility call back with better assessment.  Thanks

## 2015-10-26 NOTE — Telephone Encounter (Signed)
Patient Name: Rebekah Ball  DOB: September 03, 1920    Initial Comment Caller is with [facility] and is a Psychologist, occupational. He states patient has bleeding under the skin   Nurse Assessment  Nurse: Thad Ranger, RN, Langley Gauss Date/Time (Eastern Time): 10/26/2015 2:39:09 PM  Confirm and document reason for call. If symptomatic, describe symptoms. You must click the next button to save text entered. ---Caller is with [facility] and is a Psychologist, occupational. He states the patient has bleeding under the skin and small bruised spots on her skin. States the nurse is not at the facility at this time, and to his knowledge, the pt is not taking anticoagulants. States she has no abnormal bleeding at this time. He states he was just calling to make the pt a appt to be seen.  Has the patient traveled out of the country within the last 30 days? ---Not Applicable  Does the patient have any new or worsening symptoms? ---Yes  Will a triage be completed? ---No  Select reason for no triage. ---Other  Please document clinical information provided and list any resource used. ---Nurse is unavailable for complete pt assessment at this time. Advised to have the nurse call RN back when she is available in order for RN to get an adeq assessment, triage and give advice for this pt. Caller states he will give the facility nurse the message.     Guidelines    Guideline Title Affirmed Question Affirmed Notes       Final Disposition User

## 2015-10-26 NOTE — Telephone Encounter (Addendum)
Patient stated that she has bleeding under the skin but now is fading on patient hands, the places are the size of nickel, and faint red in color this morning were much darker. Nurse at [facility] advised patient to see MD scheduled patient for Monday earliest available. Petechiae type markings appearing and fading. Advised patient does remember hitting her arms are hands are accidentally bumping into a chair or wall patient stated no was not aware.

## 2015-10-27 MED ORDER — OMEPRAZOLE 40 MG PO CPDR: 40.0000 mg | DELAYED_RELEASE_CAPSULE | Freq: Every day | ORAL | Status: DC

## 2015-10-27 NOTE — Telephone Encounter (Signed)
NOTED,  REFILL SENT

## 2015-11-01 ENCOUNTER — Encounter: Payer: Self-pay | Admitting: Internal Medicine

## 2015-11-01 ENCOUNTER — Ambulatory Visit (INDEPENDENT_AMBULATORY_CARE_PROVIDER_SITE_OTHER): Payer: Medicare Other | Admitting: Internal Medicine

## 2015-11-01 VITALS — BP 148/82 | HR 80 | Temp 97.4°F | Resp 12 | Ht 63.0 in | Wt 153.5 lb

## 2015-11-01 DIAGNOSIS — D692 Other nonthrombocytopenic purpura: Secondary | ICD-10-CM

## 2015-11-01 DIAGNOSIS — L853 Xerosis cutis: Secondary | ICD-10-CM | POA: Diagnosis not present

## 2015-11-01 DIAGNOSIS — E559 Vitamin D deficiency, unspecified: Secondary | ICD-10-CM

## 2015-11-01 DIAGNOSIS — F919 Conduct disorder, unspecified: Secondary | ICD-10-CM

## 2015-11-01 DIAGNOSIS — R4689 Other symptoms and signs involving appearance and behavior: Principal | ICD-10-CM

## 2015-11-01 DIAGNOSIS — I10 Essential (primary) hypertension: Secondary | ICD-10-CM

## 2015-11-01 DIAGNOSIS — R4189 Other symptoms and signs involving cognitive functions and awareness: Secondary | ICD-10-CM | POA: Diagnosis not present

## 2015-11-01 NOTE — Assessment & Plan Note (Signed)
Resolved by last year's labs.  Will repeat today

## 2015-11-01 NOTE — Progress Notes (Signed)
Subjective:  Patient ID: Rebekah Ball, female    DOB: Jun 26, 1921  Age: 82 y.o. MRN: [id number]  CC: The primary encounter diagnosis was Cognitive and behavioral changes. Diagnoses of Purpura ([place]), Vitamin D deficiency, Xerosis of skin, and Essential hypertension were also pertinent to this visit.  HPI Rebekah Ball presents for follow up on several issues   1) Hypertension: tolerating meds.  No fluid retention.    2) minor scratch on her right lower leg, concerned that she bled spontaneously  3) cognitive changes  [place] resident .     Plays bridge  several times per week and watches TV  No falls in the past year.  Not using a walker .  Appetite is good but small .  Weight is stable.    BMs are soft      Outpatient Prescriptions Prior to Visit  Medication Sig Dispense Refill  . amLODipine (NORVASC) 10 MG tablet TAKE 1 TABLET EVERY DAY 90 tablet 1  . LUMIGAN 0.01 % SOLN Place 1 drop into the left eye at bedtime.    . ergocalciferol (DRISDOL) 50000 UNITS capsule Take 1 capsule (50,000 Units total) by mouth once a week. (Patient not taking: Reported on 11/01/2015) 12 capsule 0  . omeprazole (PRILOSEC) 40 MG capsule Take 1 capsule (40 mg total) by mouth daily. (Patient not taking: Reported on 11/01/2015) 30 capsule 5   No facility-administered medications prior to visit.    Review of Systems;  Patient denies headache, fevers, malaise, unintentional weight loss, skin rash, eye pain, sinus congestion and sinus pain, sore throat, dysphagia,  hemoptysis , cough, dyspnea, wheezing, chest pain, palpitations, orthopnea, edema, abdominal pain, nausea, melena, diarrhea, constipation, flank pain, dysuria, hematuria, urinary  Frequency, nocturia, numbness, tingling, seizures,  Focal weakness, Loss of consciousness,  Tremor, insomnia, depression, anxiety, and suicidal ideation.      Objective:  BP 148/82 mmHg  Pulse 80  Temp(Src) 97.4 F (36.3 C) (Oral)  Resp 12  Ht 5\' 3"   (1.6 m)  Wt 153 lb 8 oz (69.627 kg)  BMI 27.20 kg/m2  SpO2 96%  BP Readings from Last 3 Encounters:  11/01/15 148/82  02/03/15 140/78  07/27/14 168/100    Wt Readings from Last 3 Encounters:  11/01/15 153 lb 8 oz (69.627 kg)  02/03/15 157 lb 8 oz (71.442 kg)  07/27/14 153 lb 8 oz (69.627 kg)    General appearance: alert, cooperative and appears stated age Ears: normal TM's and external ear canals both ears Throat: lips, mucosa, and tongue normal; teeth and gums normal Neck: no adenopathy, no carotid bruit, supple, symmetrical, trachea midline and thyroid not enlarged, symmetric, no tenderness/mass/nodules Back: symmetric, no curvature. ROM normal. No CVA tenderness. Lungs: clear to auscultation bilaterally Heart: regular rate and rhythm, S1, S2 normal, no murmur, click, rub or gallop Abdomen: soft, non-tender; bowel sounds normal; no masses,  no organomegaly Pulses: 2+ and symmetric Skin: Skin color, texture, turgor normal. No rashes or lesions Lymph nodes: Cervical, supraclavicular, and axillary nodes normal.  No results found for: HGBA1C  Lab Results  Component Value Date   CREATININE 0.95 11/01/2015   CREATININE 0.98 02/03/2015   CREATININE 0.9 07/27/2014    Lab Results  Component Value Date   WBC 7.9 11/01/2015   HGB 14.2 11/01/2015   HCT 42.1 11/01/2015   PLT 252.0 11/01/2015   GLUCOSE 97 11/01/2015   ALT 12 11/01/2015   AST 19 11/01/2015   NA 138 11/01/2015   K  4.3 11/01/2015   CL 107 11/01/2015   CREATININE 0.95 11/01/2015   BUN 14 11/01/2015   CO2 24 11/01/2015   TSH 4.80* 11/01/2015   INR 1.0 01/10/2014    Assessment & Plan:   Problem List Items Addressed This Visit    Hypertension    Well controlled on current regimen. Renal function stable, no changes today.  Lab Results  Component Value Date   CREATININE 0.95 11/01/2015   Lab Results  Component Value Date   NA 138 11/01/2015   K 4.3 11/01/2015   CL 107 11/01/2015   CO2 24 11/01/2015           Xerosis of skin    Advised to use Aveeno bid.  No signs of spontaneous bleeding or skin cancer.       Cognitive and behavioral changes - Primary    She exhibits some confusion over medications and details, however sh is oriented.. Given her age,  No further workup .  Taking meds correctly.       Relevant Orders   TSH (Completed)    Other Visit Diagnoses    Purpura ([place])        Relevant Orders    Vitamin B12 (Completed)    Comprehensive metabolic panel (Completed)    CBC with Differential/Platelet (Completed)    IBC panel (Completed)    Vitamin D deficiency        Relevant Orders    VITAMIN D 25 Hydroxy (Vit-D Deficiency, Fractures) (Completed)      A total of 25 minutes of face to face time was spent with patient more than half of which was spent in counselling about the above mentioned conditions  and coordination of care   I have discontinued Ms. Alverson's ergocalciferol and omeprazole. I am also having her maintain her LUMIGAN and amLODipine.  No orders of the defined types were placed in this encounter.    Medications Discontinued During This Encounter  Medication Reason  . omeprazole (PRILOSEC) 40 MG capsule   . ergocalciferol (DRISDOL) 50000 UNITS capsule     Follow-up: No Follow-up on file.   Crecencio Mc, MD

## 2015-11-01 NOTE — Progress Notes (Signed)
Pre-visit discussion using our clinic review tool. No additional management support is needed unless otherwise documented below in the visit note.  

## 2015-11-01 NOTE — Patient Instructions (Addendum)
Your skin is more likely to bleed with minimal trauma when it is too dry  Please start using Aveeno or Eucerin on your skin twice daily    Continue taking amlodipine for your  blood pressure

## 2015-11-02 DIAGNOSIS — R4189 Other symptoms and signs involving cognitive functions and awareness: Secondary | ICD-10-CM | POA: Insufficient documentation

## 2015-11-02 DIAGNOSIS — R4689 Other symptoms and signs involving appearance and behavior: Principal | ICD-10-CM

## 2015-11-02 DIAGNOSIS — L853 Xerosis cutis: Secondary | ICD-10-CM | POA: Insufficient documentation

## 2015-11-02 LAB — COMPREHENSIVE METABOLIC PANEL
ALK PHOS: 48 U/L (ref 39–117)
ALT: 12 U/L (ref 0–35)
AST: 19 U/L (ref 0–37)
Albumin: 4.2 g/dL (ref 3.5–5.2)
BILIRUBIN TOTAL: 0.5 mg/dL (ref 0.2–1.2)
BUN: 14 mg/dL (ref 6–23)
CO2: 24 meq/L (ref 19–32)
CREATININE: 0.95 mg/dL (ref 0.40–1.20)
Calcium: 9.4 mg/dL (ref 8.4–10.5)
Chloride: 107 mEq/L (ref 96–112)
GFR: 58.41 mL/min — AB (ref >60.00)
GLUCOSE: 97 mg/dL (ref 70–99)
Potassium: 4.3 mEq/L (ref 3.5–5.1)
SODIUM: 138 meq/L (ref 135–145)
TOTAL PROTEIN: 7.5 g/dL (ref 6.0–8.3)

## 2015-11-02 LAB — IBC PANEL
IRON: 109 ug/dL (ref 42–145)
Saturation Ratios: 28.9 % (ref 20.0–50.0)
TRANSFERRIN: 269 mg/dL (ref 212.0–360.0)

## 2015-11-02 LAB — VITAMIN D 25 HYDROXY (VIT D DEFICIENCY, FRACTURES): VITD: 28.93 ng/mL — ABNORMAL LOW (ref 30.00–100.00)

## 2015-11-02 LAB — CBC WITH DIFFERENTIAL/PLATELET
BASOS ABS: 0.1 10*3/uL (ref 0.0–0.1)
BASOS PCT: 1.1 % (ref 0.0–3.0)
EOS ABS: 0.1 10*3/uL (ref 0.0–0.7)
Eosinophils Relative: 1.2 % (ref 0.0–5.0)
HEMATOCRIT: 42.1 % (ref 36.0–46.0)
Hemoglobin: 14.2 g/dL (ref 12.0–15.0)
LYMPHS ABS: 2.7 10*3/uL (ref 0.7–4.0)
LYMPHS PCT: 34.6 % (ref 12.0–46.0)
MCHC: 33.7 g/dL (ref 30.0–36.0)
MCV: 91.4 fl (ref 78.0–100.0)
MONO ABS: 0.7 10*3/uL (ref 0.1–1.0)
Monocytes Relative: 9.5 % (ref 3.0–12.0)
NEUTROS ABS: 4.2 10*3/uL (ref 1.4–7.7)
NEUTROS PCT: 53.6 % (ref 43.0–77.0)
PLATELETS: 252 10*3/uL (ref 150.0–400.0)
RBC: 4.61 Mil/uL (ref 3.87–5.11)
RDW: 14.4 % (ref 11.5–15.5)
WBC: 7.9 10*3/uL (ref 4.0–10.5)

## 2015-11-02 LAB — TSH: TSH: 4.8 u[IU]/mL — ABNORMAL HIGH (ref 0.35–4.50)

## 2015-11-02 LAB — VITAMIN B12: VITAMIN B 12: 352 pg/mL (ref 211–911)

## 2015-11-02 NOTE — Assessment & Plan Note (Signed)
She exhibits some confusion over medications and details, however sh is oriented.. Given her age,  No further workup .  Taking meds correctly.

## 2015-11-02 NOTE — Assessment & Plan Note (Signed)
Well controlled on current regimen. Renal function stable, no changes today.  Lab Results  Component Value Date   CREATININE 0.95 11/01/2015   Lab Results  Component Value Date   NA 138 11/01/2015   K 4.3 11/01/2015   CL 107 11/01/2015   CO2 24 11/01/2015

## 2015-11-02 NOTE — Assessment & Plan Note (Signed)
Advised to use Aveeno bid.  No signs of spontaneous bleeding or skin cancer.

## 2015-11-05 ENCOUNTER — Encounter: Payer: Self-pay | Admitting: *Deleted

## 2015-12-17 DIAGNOSIS — H35352 Cystoid macular degeneration, left eye: Secondary | ICD-10-CM | POA: Diagnosis not present

## 2015-12-27 ENCOUNTER — Telehealth: Payer: Self-pay | Admitting: Internal Medicine

## 2015-12-27 NOTE — Telephone Encounter (Signed)
Pt daughter in law called about wanting to document that pt had a allergic reaction to medication amLODipine (NORVASC) 10 MG tablet. Pt reaction was ankle feet and toes turned purple and swollen and her forearm bled and scaly. Pt was seen by a provider at the [facility]. Call daughter in law @ [phone]. Thank you!

## 2015-12-27 NOTE — Telephone Encounter (Signed)
Family went to visit patient and her feet were swollen, forearms were cracked and bleeding, so they took her to see the nurse there and they advised her to come off the Amlodopine.  Stated that those are side effects of the medication.    She has been off the medications for about a week.  Family saw her yesterday and her color is back to normal, feet are not swollen anymore.  Please advise if you want me to list this on her allergy list. thanks

## 2015-12-27 NOTE — Telephone Encounter (Signed)
Yes, please add.  Please ask [facility] RN to send BP readings

## 2015-12-28 NOTE — Telephone Encounter (Signed)
Attempted to call Cecille Rubin at [facility] and left a message with the clinic volunteer as she is in a meeting.  Lori's number is [phone].

## 2016-01-03 ENCOUNTER — Other Ambulatory Visit: Payer: Self-pay | Admitting: Internal Medicine

## 2016-01-03 NOTE — Telephone Encounter (Signed)
Attempted to reach Rebekah Ball again, volunteer stated she was out of the office again, I will return the call in the am. thanks

## 2016-01-04 DIAGNOSIS — H401122 Primary open-angle glaucoma, left eye, moderate stage: Secondary | ICD-10-CM | POA: Diagnosis not present

## 2016-01-05 NOTE — Telephone Encounter (Signed)
Finally spoke with Cecille Rubin at [facility].  So they never saw the patient, but received a phone call from the family that the patient was having an allergic reaction to the medication.  They advised her to notify our office.  Patient has not had any BP readings done in the past 99months with the nurse at [facility].  I requested to have it checked and faxed to [place] now to follow up.  She was notifing the patient.  Thanks

## 2016-01-24 DIAGNOSIS — H35352 Cystoid macular degeneration, left eye: Secondary | ICD-10-CM | POA: Diagnosis not present

## 2016-03-10 DIAGNOSIS — H35352 Cystoid macular degeneration, left eye: Secondary | ICD-10-CM | POA: Diagnosis not present

## 2016-04-18 ENCOUNTER — Other Ambulatory Visit: Payer: Self-pay

## 2016-06-01 DIAGNOSIS — Z23 Encounter for immunization: Secondary | ICD-10-CM | POA: Diagnosis not present

## 2016-06-09 DIAGNOSIS — H35352 Cystoid macular degeneration, left eye: Secondary | ICD-10-CM | POA: Diagnosis not present

## 2016-06-15 ENCOUNTER — Telehealth: Payer: Self-pay | Admitting: Internal Medicine

## 2016-06-15 NOTE — Telephone Encounter (Signed)
I called pt and left a vm to call office to sch AWV. Thank you! °

## 2016-07-04 DIAGNOSIS — H401122 Primary open-angle glaucoma, left eye, moderate stage: Secondary | ICD-10-CM | POA: Diagnosis not present

## 2016-07-24 ENCOUNTER — Ambulatory Visit: Payer: Self-pay

## 2016-07-24 DIAGNOSIS — L821 Other seborrheic keratosis: Secondary | ICD-10-CM | POA: Diagnosis not present

## 2016-07-24 DIAGNOSIS — Z08 Encounter for follow-up examination after completed treatment for malignant neoplasm: Secondary | ICD-10-CM | POA: Diagnosis not present

## 2016-07-24 DIAGNOSIS — Z85828 Personal history of other malignant neoplasm of skin: Secondary | ICD-10-CM | POA: Diagnosis not present

## 2016-07-31 ENCOUNTER — Ambulatory Visit (INDEPENDENT_AMBULATORY_CARE_PROVIDER_SITE_OTHER): Payer: Medicare Other | Admitting: Internal Medicine

## 2016-07-31 VITALS — BP 140/96 | HR 75 | Temp 97.5°F | Resp 14 | Ht 62.0 in | Wt 151.5 lb

## 2016-07-31 DIAGNOSIS — D500 Iron deficiency anemia secondary to blood loss (chronic): Secondary | ICD-10-CM

## 2016-07-31 DIAGNOSIS — E559 Vitamin D deficiency, unspecified: Secondary | ICD-10-CM

## 2016-07-31 DIAGNOSIS — R4189 Other symptoms and signs involving cognitive functions and awareness: Secondary | ICD-10-CM

## 2016-07-31 DIAGNOSIS — R4689 Other symptoms and signs involving appearance and behavior: Secondary | ICD-10-CM

## 2016-07-31 DIAGNOSIS — Z0000 Encounter for general adult medical examination without abnormal findings: Secondary | ICD-10-CM | POA: Diagnosis not present

## 2016-07-31 DIAGNOSIS — Z66 Do not resuscitate: Secondary | ICD-10-CM

## 2016-07-31 DIAGNOSIS — R5383 Other fatigue: Secondary | ICD-10-CM

## 2016-07-31 DIAGNOSIS — I10 Essential (primary) hypertension: Secondary | ICD-10-CM

## 2016-07-31 LAB — IBC PANEL
Iron: 76 ug/dL (ref 42–145)
Saturation Ratios: 21.5 % (ref 20.0–50.0)
TRANSFERRIN: 253 mg/dL (ref 212.0–360.0)

## 2016-07-31 LAB — COMPREHENSIVE METABOLIC PANEL
ALK PHOS: 52 U/L (ref 39–117)
ALT: 9 U/L (ref 0–35)
AST: 18 U/L (ref 0–37)
Albumin: 4 g/dL (ref 3.5–5.2)
BILIRUBIN TOTAL: 0.4 mg/dL (ref 0.2–1.2)
BUN: 17 mg/dL (ref 6–23)
CALCIUM: 9.2 mg/dL (ref 8.4–10.5)
CO2: 22 meq/L (ref 19–32)
Chloride: 105 mEq/L (ref 96–112)
Creatinine, Ser: 0.87 mg/dL (ref 0.40–1.20)
GFR: 64.54 mL/min (ref >60.00)
Glucose, Bld: 125 mg/dL — ABNORMAL HIGH (ref 70–99)
POTASSIUM: 4.2 meq/L (ref 3.5–5.1)
Sodium: 137 mEq/L (ref 135–145)
Total Protein: 7.3 g/dL (ref 6.0–8.3)

## 2016-07-31 LAB — CBC WITH DIFFERENTIAL/PLATELET
BASOS ABS: 0 10*3/uL (ref 0.0–0.1)
BASOS PCT: 0.5 % (ref 0.0–3.0)
EOS PCT: 1.4 % (ref 0.0–5.0)
Eosinophils Absolute: 0.1 10*3/uL (ref 0.0–0.7)
HEMATOCRIT: 41.7 % (ref 36.0–46.0)
Hemoglobin: 14.1 g/dL (ref 12.0–15.0)
LYMPHS ABS: 2.4 10*3/uL (ref 0.7–4.0)
LYMPHS PCT: 31.6 % (ref 12.0–46.0)
MCHC: 33.8 g/dL (ref 30.0–36.0)
MCV: 91.5 fl (ref 78.0–100.0)
MONOS PCT: 10.3 % (ref 3.0–12.0)
Monocytes Absolute: 0.8 10*3/uL (ref 0.1–1.0)
NEUTROS ABS: 4.3 10*3/uL (ref 1.4–7.7)
NEUTROS PCT: 56.2 % (ref 43.0–77.0)
PLATELETS: 244 10*3/uL (ref 150.0–400.0)
RBC: 4.56 Mil/uL (ref 3.87–5.11)
RDW: 14 % (ref 11.5–15.5)
WBC: 7.7 10*3/uL (ref 4.0–10.5)

## 2016-07-31 LAB — VITAMIN D 25 HYDROXY (VIT D DEFICIENCY, FRACTURES): VITD: 27.91 ng/mL — ABNORMAL LOW (ref 30.00–100.00)

## 2016-07-31 NOTE — Progress Notes (Signed)
Patient ID: Rebekah Ball, female    DOB: 1921/06/07  Age: 82 y.o. MRN: [id number]  The patient is here for annual Medicare wellnes examination and management of other chronic and acute problems.     The risk factors are reflected in the social history.  The roster of all physicians providing medical care to patient - is listed in the Snapshot section of the chart.  Activities of daily living:  The patient is 100% independent in all ADLs: dressing, toileting, feeding as well as independent mobilitLives independently in the [place] [facility] [place]. Still plays bridge  Several times per week.  Son and dtr in law visit bi weekly from [place] and take her grocery shopping, out to dinner, etc,  She has given up driving. y.  Home safety : The patient has smoke detectors in the home. They wear seatbelts.  There are no firearms at home. There is no violence in the home.  She has had no falls. Some occasional balance,  Very apprehensive about falling,  Home has  no steps inside or out. Walk-in  Shower has a railing on both sides,  No tub. toilet seat is raised with arm rails .  Wears Life Alert   There is no risks for hepatitis, STDs or HIV. There is no   history of blood transfusion. They have no travel history to infectious disease endemic areas of the world.  The patient has seen their dentist in the last six month. They have seen their eye doctor in the last year. They admit to slight hearing difficulty with regard to whispered voices and some television programs.  They wear a hearing aid.  They do not  have excessive sun exposure. Discussed the need for sun protection: hats, long sleeves and use of sunscreen if there is significant sun exposure.   Diet: the importance of a healthy diet is discussed. They do have a healthy diet.  The benefits of regular aerobic exercise were discussed. She avoids walking except to and from locations indoors.  She reports that her weak  Ankles make her avoid gravel and pavements. for  fear of falling,  And has artificial knees and hips, has stopped going to the pool  and walks the halls inside . About < 3 minutes per trip.   Uses a scooter  For any travel outside the building . Does not drive  During the course of the visit , End of Life objectives were discussed at length,  Patient has an out of hospitla DNR order posted on her refrigerator and has a living will in place and a healthcare power of attorney  Son Vickey Sages Pousson and Fynley Kleinhenz  (they live in [place] and visit every 2 weeks) .      Depression screen: there are no signs or vegative symptoms of depression- irritability, change in appetite, anhedonia, sadness/tearfullness.  Cognitive assessment: the patient does not managed her financial and personal affairs and is actively engaged. They could relate day,date,year and events; recalled 1/3 objects at 3 minutes; performed clock-face test normally.  The following portions of the patient's history were reviewed and updated as appropriate: allergies, current medications, past family history, past medical history,  past surgical history, past social history  and problem list.  Visual acuity was not assessed per patient preference since she has regular follow up with her ophthalmologist. Hearing and body mass index were assessed and reviewed.   During the course of the visit the patient was educated and counseled about  appropriate screening and preventive services including : fall prevention , diabetes screening, nutrition counseling, colorectal cancer screening, and recommended immunizations.    CC: The primary encounter diagnosis was Fatigue, unspecified type. Diagnoses of Iron deficiency anemia due to chronic blood loss, Vitamin D deficiency, Medicare annual wellness visit, subsequent, Essential hypertension, Cognitive and behavioral changes, and Do not resuscitate were also pertinent to this visit.   bp elevated   Not taking amlodipine due to history of intolerance (edema)   Thinks she has white coat hypertension.  Discussed asking [facility] nurse to check vital signs weekly for 5 weeks  History of TIA , takes a baby aspirin daily    History [place] has a past medical history of Anemia.   She has a past surgical history that includes Joint replacement.   Her family history is not on file.She reports that she has quit smoking. She does not have any smokeless tobacco history on file. She reports that she drinks alcohol. Her drug history is not on file.  Outpatient Medications Prior to Visit  Medication Sig Dispense Refill  . LUMIGAN 0.01 % SOLN Place 1 drop into the left eye at bedtime.    Marland Kitchen amLODipine (NORVASC) 10 MG tablet TAKE 1 TABLET EVERY DAY 90 tablet 1   No facility-administered medications prior to visit.     Review of Systems   Patient denies headache, fevers, malaise, unintentional weight loss, skin rash, eye pain, sinus congestion and sinus pain, sore throat, dysphagia,  hemoptysis , cough, dyspnea, wheezing, chest pain, palpitations, orthopnea, edema, abdominal pain, nausea, melena, diarrhea, constipation, flank pain, dysuria, hematuria, urinary  Frequency, nocturia, numbness, tingling, seizures,  Focal weakness, Loss of consciousness,  Tremor, insomnia, depression, anxiety, and suicidal ideation.      Objective:  BP (!) 140/96   Pulse 75   Temp 97.5 F (36.4 C) (Oral)   Resp 14   Ht 5\' 2"  (1.575 m)   Wt 151 lb 8 oz (68.7 kg)   SpO2 94%   BMI 27.71 kg/m   Physical Exam   General appearance: alert, cooperative and appears stated age Ears: normal TM's and external ear canals both ears Throat: lips, mucosa, and tongue normal; teeth and gums normal Neck: no adenopathy, no carotid bruit, supple, symmetrical, trachea midline and thyroid not enlarged, symmetric, no tenderness/mass/nodules Back: symmetric, no curvature. ROM normal. No CVA tenderness. Lungs: clear to auscultation bilaterally Heart: regular rate and rhythm, S1, S2 normal, no  murmur, click, rub or gallop Abdomen: soft, non-tender; bowel sounds normal; no masses,  no organomegaly Pulses: 2+ and symmetric Skin: Skin color, texture, turgor normal. No rashes or lesions Lymph nodes: Cervical, supraclavicular, and axillary nodes normal.   Assessment & Plan:   Problem List Items Addressed This Visit    Anemia   Relevant Orders   IBC panel (Completed)   Cognitive and behavioral changes    Mild, with short term memory changes to do not preclude her from playing bridge,  Her son has POA and manages her affairs,       Do not resuscitate    Reviewed patient's  Do Not Resuscitate Orders which were made last year.  Patient continues to desire DNR status and has the order prominently displaced in the home.       Hypertension    elevation noted today.  Patient has been asked to have the rn at [facility]  bp at home and submit readings in 5 weeks.       Medicare annual  wellness visit, subsequent    Annual Medicare wellness  exam was done as well as a comprehensive physical exam and management of acute and chronic conditions .  During the course of the visit the patient was educated and counseled about appropriate screening and preventive services including : fall prevention , diabetes screening, nutrition counseling, colorectal cancer screening, and recommended immunizations.  Printed recommendations for health maintenance screenings was given.        Other Visit Diagnoses    Fatigue, unspecified type    -  Primary   Relevant Orders   CBC with Differential/Platelet (Completed)   Comprehensive metabolic panel (Completed)   Vitamin D deficiency       Relevant Orders   VITAMIN D 25 Hydroxy (Vit-D Deficiency, Fractures) (Completed)      I have discontinued Ms. Doro's amLODipine. I am also having her maintain her LUMIGAN.  No orders of the defined types were placed in this encounter.   Medications Discontinued During This Encounter  Medication Reason  .  amLODipine (NORVASC) 10 MG tablet     Follow-up: Return in about 6 months (around 01/29/2017).   Crecencio Mc, MD

## 2016-07-31 NOTE — Patient Instructions (Addendum)
Your blood pressure is still elevated.   Please  Have the Nurse at [facility] check your BP 5 times over the next  5 weeks  and send me the readings   Health Maintenance for Postmenopausal Women Introduction Menopause is a normal process in which your reproductive ability comes to an end. This process happens gradually over a span of months to years, usually between the ages of 34 and 52. Menopause is complete when you have missed 12 consecutive menstrual periods. It is important to talk with your health care provider about some of the most common conditions that affect postmenopausal women, such as heart disease, cancer, and bone loss (osteoporosis). Adopting a healthy lifestyle and getting preventive care can help to promote your health and wellness. Those actions can also lower your chances of developing some of these common conditions. What should I know about menopause? During menopause, you may experience a number of symptoms, such as:  Moderate-to-severe hot flashes.  Night sweats.  Decrease in sex drive.  Mood swings.  Headaches.  Tiredness.  Irritability.  Memory problems.  Insomnia. Choosing to treat or not to treat menopausal changes is an individual decision that you make with your health care provider. What should I know about hormone replacement therapy and supplements? Hormone therapy products are effective for treating symptoms that are associated with menopause, such as hot flashes and night sweats. Hormone replacement carries certain risks, especially as you become older. If you are thinking about using estrogen or estrogen with progestin treatments, discuss the benefits and risks with your health care provider. What should I know about heart disease and stroke? Heart disease, heart attack, and stroke become more likely as you age. This may be due, in part, to the hormonal changes that your body experiences during menopause. These can affect how your body processes  dietary fats, triglycerides, and cholesterol. Heart attack and stroke are both medical emergencies. There are many things that you can do to help prevent heart disease and stroke:  Have your blood pressure checked at least every 1-2 years. High blood pressure causes heart disease and increases the risk of stroke.  If you are 80-41 years old, ask your health care provider if you should take aspirin to prevent a heart attack or a stroke.  Do not use any tobacco products, including cigarettes, chewing tobacco, or electronic cigarettes. If you need help quitting, ask your health care provider.  It is important to eat a healthy diet and maintain a healthy weight.  Be sure to include plenty of vegetables, fruits, low-fat dairy products, and lean protein.  Avoid eating foods that are high in solid fats, added sugars, or salt (sodium).  Get regular exercise. This is one of the most important things that you can do for your health.  Try to exercise for at least 150 minutes each week. The type of exercise that you do should increase your heart rate and make you sweat. This is known as moderate-intensity exercise.  Try to do strengthening exercises at least twice each week. Do these in addition to the moderate-intensity exercise.  Know your numbers.Ask your health care provider to check your cholesterol and your blood glucose. Continue to have your blood tested as directed by your health care provider. What should I know about cancer screening? There are several types of cancer. Take the following steps to reduce your risk and to catch any cancer development as early as possible. Breast Cancer  Practice breast self-awareness.  This means understanding  how your breasts normally appear and feel.  It also means doing regular breast self-exams. Let your health care provider know about any changes, no matter how small.  If you are 40 or older, have a clinician do a breast exam (clinical breast exam  or CBE) every year. Depending on your age, family history, and medical history, it may be recommended that you also have a yearly breast X-ray (mammogram).  If you have a family history of breast cancer, talk with your health care provider about genetic screening.  If you are at high risk for breast cancer, talk with your health care provider about having an MRI and a mammogram every year.  Breast cancer (BRCA) gene test is recommended for women who have family members with BRCA-related cancers. Results of the assessment will determine the need for genetic counseling and BRCA1 and for BRCA2 testing. BRCA-related cancers include these types:  Breast. This occurs in males or females.  Ovarian.  Tubal. This may also be called fallopian tube cancer.  Cancer of the abdominal or pelvic lining (peritoneal cancer).  Prostate.  Pancreatic. Cervical, Uterine, and Ovarian Cancer  Your health care provider may recommend that you be screened regularly for cancer of the pelvic organs. These include your ovaries, uterus, and vagina. This screening involves a pelvic exam, which includes checking for microscopic changes to the surface of your cervix (Pap test).  For women ages 21-65, health care providers may recommend a pelvic exam and a Pap test every three years. For women ages 77-65, they may recommend the Pap test and pelvic exam, combined with testing for human papilloma virus (HPV), every five years. Some types of HPV increase your risk of cervical cancer. Testing for HPV may also be done on women of any age who have unclear Pap test results.  Other health care providers may not recommend any screening for nonpregnant women who are considered low risk for pelvic cancer and have no symptoms. Ask your health care provider if a screening pelvic exam is right for you.  If you have had past treatment for cervical cancer or a condition that could lead to cancer, you need Pap tests and screening for cancer  for at least 20 years after your treatment. If Pap tests have been discontinued for you, your risk factors (such as having a new sexual partner) need to be reassessed to determine if you should start having screenings again. Some women have medical problems that increase the chance of getting cervical cancer. In these cases, your health care provider may recommend that you have screening and Pap tests more often.  If you have a family history of uterine cancer or ovarian cancer, talk with your health care provider about genetic screening.  If you have vaginal bleeding after reaching menopause, tell your health care provider.  There are currently no reliable tests available to screen for ovarian cancer. Lung Cancer  Lung cancer screening is recommended for adults 32-80 years old who are at high risk for lung cancer because of a history of smoking. A yearly low-dose CT scan of the lungs is recommended if you:  Currently smoke.  Have a history of at least 30 pack-years of smoking and you currently smoke or have quit within the past 15 years. A pack-year is smoking an average of one pack of cigarettes per day for one year. Yearly screening should:  Continue until it has been 15 years since you quit.  Stop if you develop a health problem that  would prevent you from having lung cancer treatment. Colorectal Cancer  This type of cancer can be detected and can often be prevented.  Routine colorectal cancer screening usually begins at age 64 and continues through age 85.  If you have risk factors for colon cancer, your health care provider may recommend that you be screened at an earlier age.  If you have a family history of colorectal cancer, talk with your health care provider about genetic screening.  Your health care provider may also recommend using home test kits to check for hidden blood in your stool.  A small camera at the end of a tube can be used to examine your colon directly  (sigmoidoscopy or colonoscopy). This is done to check for the earliest forms of colorectal cancer.  Direct examination of the colon should be repeated every 5-10 years until age 74. However, if early forms of precancerous polyps or small growths are found or if you have a family history or genetic risk for colorectal cancer, you may need to be screened more often. Skin Cancer  Check your skin from head to toe regularly.  Monitor any moles. Be sure to tell your health care provider:  About any new moles or changes in moles, especially if there is a change in a mole's shape or color.  If you have a mole that is larger than the size of a pencil eraser.  If any of your family members has a history of skin cancer, especially at a young age, talk with your health care provider about genetic screening.  Always use sunscreen. Apply sunscreen liberally and repeatedly throughout the day.  Whenever you are outside, protect yourself by wearing long sleeves, pants, a wide-brimmed hat, and sunglasses. What should I know about osteoporosis? Osteoporosis is a condition in which bone destruction happens more quickly than new bone creation. After menopause, you may be at an increased risk for osteoporosis. To help prevent osteoporosis or the bone fractures that can happen because of osteoporosis, the following is recommended:  If you are 50-54 years old, get at least 1,000 mg of calcium and at least 600 mg of vitamin D per day.  If you are older than age 24 but younger than age 19, get at least 1,200 mg of calcium and at least 600 mg of vitamin D per day.  If you are older than age 42, get at least 1,200 mg of calcium and at least 800 mg of vitamin D per day. Smoking and excessive alcohol intake increase the risk of osteoporosis. Eat foods that are rich in calcium and vitamin D, and do weight-bearing exercises several times each week as directed by your health care provider. What should I know about how  menopause affects my mental health? Depression may occur at any age, but it is more common as you become older. Common symptoms of depression include:  Low or sad mood.  Changes in sleep patterns.  Changes in appetite or eating patterns.  Feeling an overall lack of motivation or enjoyment of activities that you previously enjoyed.  Frequent crying spells. Talk with your health care provider if you think that you are experiencing depression. What should I know about immunizations? It is important that you get and maintain your immunizations. These include:  Tetanus, diphtheria, and pertussis (Tdap) booster vaccine.  Influenza every year before the flu season begins.  Pneumonia vaccine.  Shingles vaccine. Your health care provider may also recommend other immunizations. This information is not intended to  replace advice given to you by your health care provider. Make sure you discuss any questions you have with your health care provider. Document Released: 09/22/2005 Document Revised: 02/18/2016 Document Reviewed: 05/04/2015  2017 Elsevier

## 2016-08-01 NOTE — Assessment & Plan Note (Signed)
elevation noted today.  Patient has been asked to have the rn at [place]  bp at home and submit readings in 5 weeks.

## 2016-08-01 NOTE — Assessment & Plan Note (Signed)

## 2016-08-01 NOTE — Assessment & Plan Note (Signed)
Mild, with short term memory changes to do not preclude her from playing bridge,  Her son has POA and manages her affairs,

## 2016-08-01 NOTE — Assessment & Plan Note (Signed)
Reviewed patient's  Do Not Resuscitate Orders which were made last year.  Patient continues to desire DNR status and has the order prominently displaced in the home.  

## 2016-08-16 ENCOUNTER — Ambulatory Visit: Payer: Self-pay

## 2016-11-21 DIAGNOSIS — H6123 Impacted cerumen, bilateral: Secondary | ICD-10-CM | POA: Diagnosis not present

## 2016-11-21 DIAGNOSIS — H905 Unspecified sensorineural hearing loss: Secondary | ICD-10-CM | POA: Diagnosis not present

## 2017-01-31 ENCOUNTER — Ambulatory Visit (INDEPENDENT_AMBULATORY_CARE_PROVIDER_SITE_OTHER): Payer: Medicare Other | Admitting: Internal Medicine

## 2017-01-31 ENCOUNTER — Encounter: Payer: Self-pay | Admitting: Internal Medicine

## 2017-01-31 VITALS — BP 132/64 | HR 91 | Temp 98.0°F | Resp 18 | Wt 146.0 lb

## 2017-01-31 DIAGNOSIS — I10 Essential (primary) hypertension: Secondary | ICD-10-CM

## 2017-01-31 DIAGNOSIS — M25512 Pain in left shoulder: Secondary | ICD-10-CM | POA: Diagnosis not present

## 2017-01-31 DIAGNOSIS — G8929 Other chronic pain: Secondary | ICD-10-CM | POA: Diagnosis not present

## 2017-01-31 DIAGNOSIS — E559 Vitamin D deficiency, unspecified: Secondary | ICD-10-CM | POA: Diagnosis not present

## 2017-01-31 LAB — VITAMIN D 25 HYDROXY (VIT D DEFICIENCY, FRACTURES): VITD: 30.81 ng/mL (ref 30.00–100.00)

## 2017-01-31 LAB — COMPREHENSIVE METABOLIC PANEL
ALBUMIN: 4.1 g/dL (ref 3.5–5.2)
ALK PHOS: 46 U/L (ref 39–117)
ALT: 11 U/L (ref 0–35)
AST: 15 U/L (ref 0–37)
BILIRUBIN TOTAL: 0.6 mg/dL (ref 0.2–1.2)
BUN: 13 mg/dL (ref 6–23)
CALCIUM: 9.7 mg/dL (ref 8.4–10.5)
CO2: 24 mEq/L (ref 19–32)
Chloride: 105 mEq/L (ref 96–112)
Creatinine, Ser: 1.06 mg/dL (ref 0.40–1.20)
GFR: 51.33 mL/min — AB (ref >60.00)
GLUCOSE: 165 mg/dL — AB (ref 70–99)
POTASSIUM: 4.4 meq/L (ref 3.5–5.1)
Sodium: 139 mEq/L (ref 135–145)
TOTAL PROTEIN: 7.2 g/dL (ref 6.0–8.3)

## 2017-01-31 NOTE — Patient Instructions (Signed)
  I injected your shoulder today with steroids and lidocaine  It is important to rotate the shoulder gently to prevent it from "freezing" up  You can play bridge!  If the shoulder is not better in 2 weeks,  Call for an orthopedics referral

## 2017-01-31 NOTE — Progress Notes (Signed)
Subjective:  Patient ID: Rebekah Ball, female    DOB: 01/19/1921  Age: 83 y.o. MRN: [id number]  CC: The primary encounter diagnosis was Vitamin D deficiency. Diagnoses of Essential hypertension and Chronic left shoulder pain were also pertinent to this visit.  HPI Rebekah Ball presents for follow up on chronic issuesand acute  including hypertension and shoulder pain ,  She is not WEARING HER HEARING AIDS TODAY   Has been having left shoulder pain for the past week, with no history of trauma.  She reports pain in the lateral upper arm with minimal abduction and flexion. PAST 90 DGRESS.  .  No recent overuse or trauma.    SHE REPORTS A HISTORY OF FROZEN SHOULDER'  34 YEARS AGO .  RECALLS THE exercises that were given her to do..  Requesting a steroid injection in the  shoulder as this worked for her in the past. Not takign any anti platelet agents or aticoagulants.   Injected left shoulder wit 20 mg kenalog and 4 ml lidacine   Outpatient Medications Prior to Visit  Medication Sig Dispense Refill  . LUMIGAN 0.01 % SOLN Place 1 drop into the left eye at bedtime.     No facility-administered medications prior to visit.     Review of Systems;  Patient denies headache, fevers, malaise, unintentional weight loss, skin rash, eye pain, sinus congestion and sinus pain, sore throat, dysphagia,  hemoptysis , cough, dyspnea, wheezing, chest pain, palpitations, orthopnea, edema, abdominal pain, nausea, melena, diarrhea, constipation, flank pain, dysuria, hematuria, urinary  Frequency, nocturia, numbness, tingling, seizures,  Focal weakness, Loss of consciousness,  Tremor, insomnia, depression, anxiety, and suicidal ideation.      Objective:  BP 132/64 (BP Location: Left Arm, Patient Position: Sitting, Cuff Size: Normal)   Pulse 91   Temp 98 F (36.7 C) (Oral)   Resp 18   Wt 146 lb (66.2 kg)   SpO2 96%   BMI 26.70 kg/m   BP Readings from Last 3 Encounters:  01/31/17 132/64  07/31/16 (!)  140/96  11/01/15 (!) 148/82    Wt Readings from Last 3 Encounters:  01/31/17 146 lb (66.2 kg)  07/31/16 151 lb 8 oz (68.7 kg)  11/01/15 153 lb 8 oz (69.6 kg)    General appearance: alert, cooperative and appears stated age Neck: no adenopathy, no carotid bruit, supple, symmetrical, trachea midline and thyroid not enlarged, symmetric, no tenderness/mass/nodules Back: symmetric, no curvature. ROM normal. No CVA tenderness. Lungs: clear to auscultation bilaterally Heart: regular rate and rhythm, S1, S2 normal, no murmur, click, rub or gallop Abdomen: soft, non-tender; bowel sounds normal; no masses,  no organomegaly Pulses: 2+ and symmetric Skin: Skin color, texture, turgor normal. No rashes or lesions Lymph nodes: Cervical, supraclavicular, and axillary nodes normal. MSK : left shoulder without signs of trauma,  No bruising . ROM limited to flexion   90 degrees, abduction 80 degrees..,  No internal rotation.     No results found for: HGBA1C  Lab Results  Component Value Date   CREATININE 1.06 01/31/2017   CREATININE 0.87 07/31/2016   CREATININE 0.95 11/01/2015    Lab Results  Component Value Date   WBC 7.7 07/31/2016   HGB 14.1 07/31/2016   HCT 41.7 07/31/2016   PLT 244.0 07/31/2016   GLUCOSE 165 (H) 01/31/2017   ALT 11 01/31/2017   AST 15 01/31/2017   NA 139 01/31/2017   K 4.4 01/31/2017   CL 105 01/31/2017   CREATININE 1.06 01/31/2017  BUN 13 01/31/2017   CO2 24 01/31/2017   TSH 4.80 (H) 11/01/2015   INR 1.0 01/10/2014      Assessment & Plan:   Problem List Items Addressed This Visit    Hypertension    Well controlled on current regimen. Renal function stable, no changes today.  Lab Results  Component Value Date   CREATININE 1.06 01/31/2017   Lab Results  Component Value Date   NA 139 01/31/2017   K 4.4 01/31/2017   CL 105 01/31/2017   CO2 24 01/31/2017         Relevant Orders   Comprehensive metabolic panel (Completed)   Chronic left shoulder  pain    Suspect subacromial bursitis  vs tendinopathy from bone spur.   Informed consent for left shoulder  joint injection obtained.  Area was cleaned with betadine.  Subacromial bursa was injected with Kenalog and Xylocaine .  Procedure was tolerated well and shoulder was feeling less painful by the time she left the office.  She was advised to rest the arm for 48 hours , apply ice packs every 6 hours for 15 minutes, advised to call if she develops signs of infection        Relevant Medications   triamcinolone acetonide (KENALOG-40) injection 40 mg   lidocaine (XYLOCAINE) 2 % (with pres) injection 100 mg    Other Visit Diagnoses    Vitamin D deficiency    -  Primary   Relevant Orders   VITAMIN D 25 Hydroxy (Vit-D Deficiency, Fractures) (Completed)      I am having Ms. Eustice maintain her LUMIGAN. We will continue to administer triamcinolone acetonide and lidocaine.  Meds ordered this encounter  Medications  . triamcinolone acetonide (KENALOG-40) injection 40 mg  . lidocaine (XYLOCAINE) 2 % (with pres) injection 100 mg    There are no discontinued medications.  Follow-up: Return in about 6 months (around 08/02/2017).   Crecencio Mc, MD

## 2017-02-01 ENCOUNTER — Other Ambulatory Visit: Payer: Self-pay | Admitting: Internal Medicine

## 2017-02-01 DIAGNOSIS — R7301 Impaired fasting glucose: Secondary | ICD-10-CM

## 2017-02-03 DIAGNOSIS — M25512 Pain in left shoulder: Secondary | ICD-10-CM

## 2017-02-03 DIAGNOSIS — G8929 Other chronic pain: Secondary | ICD-10-CM | POA: Insufficient documentation

## 2017-02-03 MED ORDER — TRIAMCINOLONE ACETONIDE 40 MG/ML IJ SUSP
40.0000 mg | Freq: Once | INTRAMUSCULAR | Status: AC
  Administered 2017-01-31: 40 mg via INTRAMUSCULAR

## 2017-02-03 MED ORDER — LIDOCAINE HCL 2 % IJ SOLN
5.0000 mL | Freq: Once | INTRAMUSCULAR | Status: AC
  Administered 2017-01-31: 100 mg

## 2017-02-03 NOTE — Assessment & Plan Note (Signed)
Suspect subacromial bursitis  vs tendinopathy from bone spur.   Informed consent for left shoulder  joint injection obtained.  Area was cleaned with betadine.  Subacromial bursa was injected with Kenalog and Xylocaine .  Procedure was tolerated well and shoulder was feeling less painful by the time she left the office.  She was advised to rest the arm for 48 hours , apply ice packs every 6 hours for 15 minutes, advised to call if she develops signs of infection

## 2017-02-03 NOTE — Assessment & Plan Note (Signed)
Well controlled on current regimen. Renal function stable, no changes today.  Lab Results  Component Value Date   CREATININE 1.06 01/31/2017   Lab Results  Component Value Date   NA 139 01/31/2017   K 4.4 01/31/2017   CL 105 01/31/2017   CO2 24 01/31/2017

## 2017-02-07 DIAGNOSIS — R7301 Impaired fasting glucose: Secondary | ICD-10-CM | POA: Diagnosis not present

## 2017-02-07 LAB — HEMOGLOBIN A1C: HEMOGLOBIN A1C: 5.6

## 2017-02-08 ENCOUNTER — Other Ambulatory Visit: Payer: Self-pay

## 2017-02-08 ENCOUNTER — Encounter: Payer: Self-pay | Admitting: Internal Medicine

## 2017-02-15 ENCOUNTER — Telehealth: Payer: Self-pay | Admitting: Internal Medicine

## 2017-02-15 NOTE — Telephone Encounter (Signed)
LMTCB

## 2017-02-15 NOTE — Telephone Encounter (Signed)
Diabetes screen is negative

## 2017-02-16 NOTE — Telephone Encounter (Signed)
Spoke with pt and informed her of her lab results. Pt gave a verbal understanding.  

## 2017-02-16 NOTE — Telephone Encounter (Signed)
Left VM for patient to return call back.

## 2017-03-21 ENCOUNTER — Telehealth: Payer: Self-pay | Admitting: Internal Medicine

## 2017-03-21 NOTE — Telephone Encounter (Signed)
Pt called about having a frozen shoulder on the right. Pt states that Dr Derrel Nip treated it before and now it's hurting again. No appt avail. Pt does not want to see any other provider. Let me know where to sch. Thank you!  Call pt @ [phone].

## 2017-03-21 NOTE — Telephone Encounter (Signed)
Would it be ok to schedule her for next Monday at 6:30pm or the only other thing that you have available is later that week either an 11:30 or 4:30?

## 2017-03-22 NOTE — Telephone Encounter (Signed)
yes

## 2017-03-22 NOTE — Telephone Encounter (Signed)
Patient scheduled for 630 pm on 8/13. Patient aware of appointment.

## 2017-03-26 ENCOUNTER — Ambulatory Visit (INDEPENDENT_AMBULATORY_CARE_PROVIDER_SITE_OTHER): Payer: Medicare Other

## 2017-03-26 ENCOUNTER — Ambulatory Visit (INDEPENDENT_AMBULATORY_CARE_PROVIDER_SITE_OTHER): Payer: Medicare Other | Admitting: Internal Medicine

## 2017-03-26 ENCOUNTER — Encounter: Payer: Self-pay | Admitting: Internal Medicine

## 2017-03-26 VITALS — BP 148/76 | HR 92 | Temp 97.8°F | Resp 15 | Ht 62.0 in | Wt 148.0 lb

## 2017-03-26 DIAGNOSIS — G8929 Other chronic pain: Secondary | ICD-10-CM

## 2017-03-26 DIAGNOSIS — M25512 Pain in left shoulder: Secondary | ICD-10-CM | POA: Diagnosis not present

## 2017-03-26 DIAGNOSIS — M19012 Primary osteoarthritis, left shoulder: Secondary | ICD-10-CM | POA: Diagnosis not present

## 2017-03-26 MED ORDER — MELOXICAM 7.5 MG PO TABS: 7.5000 mg | ORAL_TABLET | Freq: Every day | ORAL | 1 refills | Status: DC

## 2017-03-26 NOTE — Patient Instructions (Addendum)
I am recommending that you see Dr Roland Rack  At [facility] for evaluation of left shoulder  I am prescribing meloxicam 7.5 (an anti inflammatory ) to take once daily with food  You can add tylenol  500 mg every 6 hours for pain ,  This can be combined with meloxicam  Try using a heating pad for 15 minutes in the morning and evening to warm up the shoulder

## 2017-03-26 NOTE — Progress Notes (Signed)
Subjective:  Patient ID: Rebekah Ball, female    DOB: June 24, 1921  Age: 83 y.o. MRN: [id number]  CC: The primary encounter diagnosis was Pain in joint of left shoulder. A diagnosis of Chronic left shoulder pain was also pertinent to this visit.  HPI Rebekah Ball presents for Evaluation of  left shoulder pain .  Patient reports chronic pain in left shoulder that has been present for over a year  The pain is mild when the shoulder is not in use and often feels better when she sleeps on her left side.  She can play bridge and eat without too much pain. However she cannot raise the Shoulder to a position parallel to the floor and cannot reach for thinks in her kitchen cabinets above the counter .  She does not drive.  There is no history of fall or trauma to the shoulder.  The shoulder was injected in late June by me with [facility],  But she reports that she had incomplete and transient relief  of her pain . She reports that she forgot to do the daily exercises I gave her to prevent the shoulder from becoming frozen. The pain is interfering with her ability to dress herself.  .  She has a history of GI bleed with aspirin remotely, and has not been taking any NSAIDs.    Outpatient Medications Prior to Visit  Medication Sig Dispense Refill  . LUMIGAN 0.01 % SOLN Place 1 drop into the left eye at bedtime.     No facility-administered medications prior to visit.     Review of Systems;  Patient denies headache, fevers, malaise, unintentional weight loss, skin rash, eye pain, sinus congestion and sinus pain, sore throat, dysphagia,  hemoptysis , cough, dyspnea, wheezing, chest pain, palpitations, orthopnea, edema, abdominal pain, nausea, melena, diarrhea, constipation, flank pain, dysuria, hematuria, urinary  Frequency, nocturia, numbness, tingling, seizures,  Focal weakness, Loss of consciousness,  Tremor, insomnia, depression, anxiety, and suicidal ideation.      Objective:  BP (!) 148/76 (BP Location:  Left Arm, Patient Position: Sitting, Cuff Size: Normal)   Pulse 92   Temp 97.8 F (36.6 C) (Oral)   Resp 15   Ht 5\' 2"  (1.575 m)   Wt 148 lb (67.1 kg)   SpO2 95%   BMI 27.07 kg/m   BP Readings from Last 3 Encounters:  03/26/17 (!) 148/76  01/31/17 132/64  07/31/16 (!) 140/96    Wt Readings from Last 3 Encounters:  03/26/17 148 lb (67.1 kg)  01/31/17 146 lb (66.2 kg)  07/31/16 151 lb 8 oz (68.7 kg)    General appearance: alert, cooperative and appears stated age Back: symmetric, no curvature. ROM normal. No CVA tenderness. Lungs: clear to auscultation bilaterally Heart: regular rate and rhythm, S1, S2 normal, no murmur, click, rub or gallop Abdomen: soft, non-tender; bowel sounds normal; no masses,  no organomegaly Pulses: 2+ and symmetric Skin: Skin color, texture, turgor normal. No rashes or lesions Lymph nodes: Cervical, supraclavicular, and axillary nodes normal. MSK: no tenderness over AC , GH joints . Very  Limited ROM secondary to pain with abduction and flexion   Lab Results  Component Value Date   HGBA1C 5.6 02/07/2017    Lab Results  Component Value Date   CREATININE 1.06 01/31/2017   CREATININE 0.87 07/31/2016   CREATININE 0.95 11/01/2015    Lab Results  Component Value Date   WBC 7.7 07/31/2016   HGB 14.1 07/31/2016   HCT 41.7 07/31/2016  PLT 244.0 07/31/2016   GLUCOSE 165 (H) 01/31/2017   ALT 11 01/31/2017   AST 15 01/31/2017   NA 139 01/31/2017   K 4.4 01/31/2017   CL 105 01/31/2017   CREATININE 1.06 01/31/2017   BUN 13 01/31/2017   CO2 24 01/31/2017   TSH 4.80 (H) 11/01/2015   INR 1.0 01/10/2014   HGBA1C 5.6 02/07/2017     Assessment & Plan:   Problem List Items Addressed This Visit    Chronic left shoulder pain    She has marked G/h degenerative changes and mild A/c degenerative changes by plain films.  She had  no improvement with a subacromial injection of Kenolog in June 2018 and is requesting a second injection, which was NOT  done.  Referral to Dr Roland Rack for further evaluation advised and in process.   Trial of meloxicam 7.5 mg  Daily and tylenol.       Relevant Medications   meloxicam (MOBIC) 7.5 MG tablet    Other Visit Diagnoses    Pain in joint of left shoulder    -  Primary   Relevant Orders   DG Shoulder Left (Completed)   Ambulatory referral to Orthopedic Surgery      I am having Ms. Nam start on meloxicam. I am also having her maintain her LUMIGAN.  Meds ordered this encounter  Medications  . meloxicam (MOBIC) 7.5 MG tablet    Sig: Take 1 tablet (7.5 mg total) by mouth daily.    Dispense:  30 tablet    Refill:  1    There are no discontinued medications.  Follow-up: No Follow-up on file.   Rebekah Mc, MD

## 2017-03-27 NOTE — Assessment & Plan Note (Addendum)
She has marked G/h degenerative changes and mild A/c degenerative changes by plain films.  She had  no improvement with a subacromial injection of Kenolog in June 2018 and is requesting a second injection, which was NOT done.  Referral to Dr Roland Rack for further evaluation advised and in process.   Trial of meloxicam 7.5 mg  Daily and tylenol.

## 2017-04-11 ENCOUNTER — Telehealth: Payer: Self-pay | Admitting: Internal Medicine

## 2017-04-11 NOTE — Telephone Encounter (Signed)
Thank him for the call,  But I reviewed the hospital admission from 2015 before prescribing it to her. She did NOT have an ulcer ,  She had a diverticular bleed, which is not caused by NSAIDs. She can try the meloxicam and take otc prilosec to be proactive and protect her stomach.  However if he wants an alternative  The only option other than controlled substances is tylenol,  Up to 2000 mg daily in divided doses. .  Dr Roland Rack will not recommend surgery m he will try another steroid injection under fluoroscopy most llikely which may give her longer relief than the one I gave her.

## 2017-04-11 NOTE — Telephone Encounter (Signed)
Pt daughter in law called wanting to speak to Dr Derrel Nip regarding pt care. Pt does not remember if she had anything done. Family wants to check what was done like shots, imaging on shoulder,medication for antiimflammatory Please advise?  Call daughter in law @ [phone]. Thank you!

## 2017-04-11 NOTE — Telephone Encounter (Signed)
Spoke with pt's son, Robert(on DPR), and he had a couple of questions about the pt's last office visit when she was here for left shoulder pain. The son stated that the pt gets very confused and doesn't remember things very good anymore so he stated that the pt was prescribed meloxicam. He stated that she has not even went and picked up the rx but he is concerned about her taking it because about two years ago the pt was hospitalized at [facility] for internal bleeding due to NSAID use and the pt was told never to take any nsaids ever again. The pt's son is wanting to know if there is anything else that the pt could possibly take for the pain. He also stated that the pt has an appt scheduled with Dr. Roland Rack but his mother doesn't want to go to it because she thinks that he is just going to want to do surgery. The son stated that he has been trying to explain to her that with her age they probably are not even going to mention surgery.

## 2017-04-12 NOTE — Telephone Encounter (Signed)
Spoke with both the son and daughter in law(on [place]) and informed them of Dr. Lupita Dawn message below. They both gave a verbal understanding.

## 2017-05-08 DIAGNOSIS — Z23 Encounter for immunization: Secondary | ICD-10-CM | POA: Diagnosis not present

## 2017-05-16 DIAGNOSIS — H903 Sensorineural hearing loss, bilateral: Secondary | ICD-10-CM | POA: Diagnosis not present

## 2017-05-16 DIAGNOSIS — H6123 Impacted cerumen, bilateral: Secondary | ICD-10-CM | POA: Diagnosis not present

## 2017-06-27 ENCOUNTER — Other Ambulatory Visit: Payer: Self-pay | Admitting: Internal Medicine

## 2017-07-13 DIAGNOSIS — H401122 Primary open-angle glaucoma, left eye, moderate stage: Secondary | ICD-10-CM | POA: Diagnosis not present

## 2017-07-31 ENCOUNTER — Ambulatory Visit: Payer: Self-pay

## 2017-08-02 ENCOUNTER — Ambulatory Visit: Payer: Self-pay | Admitting: Internal Medicine

## 2017-10-02 ENCOUNTER — Telehealth: Payer: Self-pay | Admitting: Internal Medicine

## 2017-10-02 MED ORDER — MELOXICAM 7.5 MG PO TABS: 7.5000 mg | ORAL_TABLET | Freq: Every day | ORAL | 1 refills | Status: DC

## 2017-10-02 NOTE — Telephone Encounter (Signed)
Copied from [place]. Topic: Quick Communication - Rx Refill/Question >> Oct 02, 2017  4:21 PM Oneta Rack wrote: Caller name: Ching,Robert Relation to pt: son  Call back number: [phone] Pharmacy: [facility], [place] [address] [phone]  Reason for call:   Shaquana Buel (son reflected on DPR) states [place], [place] - [place] closed requesting meloxicam (MOBIC) 7.5 MG tablet sent to [facility], [place] [address] [phone]. Son states patient uses medication for shoulder pain, please advise >> Oct 02, 2017  4:27 PM Oneta Rack wrote: Caller name: Benn,Robert Relation to pt: son  Call back number: [phone] Pharmacy: [facility], [place] [address] [phone]  Reason for call:   Sheyann Sulton (son reflected on DPR) states [place], [place] - [place] closed requesting meloxicam (MOBIC) 7.5 MG tablet sent to [facility], [place] [address] [phone]. Son states patient uses medication for shoulder pain, please advise

## 2017-10-02 NOTE — Telephone Encounter (Signed)
Ok to refill meloxicam.  But reassure him /Ms Elsass that the surgeons will NOT  Want to operate on a 84 yr old lady!  They would rather do an injection

## 2017-10-02 NOTE — Telephone Encounter (Signed)
Spoke with pt's son to let him know that the medication had been refilled. The son stated that the patient does not want to go to the orthopedic that you had referred her to because she is afraid they are going to want to do surgery. So the son stated that the meloxicam really helps with his mothers pain and believes that is going to be the best method of treatment for her since she is refusing the ortho appt.

## 2017-10-03 NOTE — Telephone Encounter (Signed)
Spoke with pt's son and told him that to try to reassure pt that the surgeons will not want to operate on a 84 year old lady and that they would rather do an injection. Son stated that he has tried as well as many other people to reassure of this but the pt is not accepting.

## 2018-03-15 ENCOUNTER — Other Ambulatory Visit: Payer: Self-pay

## 2018-04-23 ENCOUNTER — Encounter: Payer: Self-pay | Admitting: Emergency Medicine

## 2018-04-23 ENCOUNTER — Other Ambulatory Visit: Payer: Self-pay

## 2018-04-23 ENCOUNTER — Emergency Department: Payer: Medicare Other

## 2018-04-23 ENCOUNTER — Emergency Department
Admission: EM | Admit: 2018-04-23 | Discharge: 2018-04-23 | Disposition: A | Discharge status: Home / Self Care | Payer: Medicare Other | Attending: Emergency Medicine | Admitting: Emergency Medicine

## 2018-04-23 DIAGNOSIS — M25551 Pain in right hip: Secondary | ICD-10-CM | POA: Insufficient documentation

## 2018-04-23 DIAGNOSIS — Z96641 Presence of right artificial hip joint: Secondary | ICD-10-CM | POA: Insufficient documentation

## 2018-04-23 DIAGNOSIS — W19XXXA Unspecified fall, initial encounter: Secondary | ICD-10-CM

## 2018-04-23 DIAGNOSIS — Z87891 Personal history of nicotine dependence: Secondary | ICD-10-CM | POA: Insufficient documentation

## 2018-04-23 DIAGNOSIS — Y9389 Activity, other specified: Secondary | ICD-10-CM | POA: Diagnosis not present

## 2018-04-23 DIAGNOSIS — Z96642 Presence of left artificial hip joint: Secondary | ICD-10-CM | POA: Insufficient documentation

## 2018-04-23 DIAGNOSIS — S79911A Unspecified injury of right hip, initial encounter: Secondary | ICD-10-CM | POA: Diagnosis not present

## 2018-04-23 DIAGNOSIS — I10 Essential (primary) hypertension: Secondary | ICD-10-CM | POA: Insufficient documentation

## 2018-04-23 DIAGNOSIS — Y92038 Other place in apartment as the place of occurrence of the external cause: Secondary | ICD-10-CM | POA: Diagnosis not present

## 2018-04-23 DIAGNOSIS — Y999 Unspecified external cause status: Secondary | ICD-10-CM | POA: Insufficient documentation

## 2018-04-23 DIAGNOSIS — W010XXA Fall on same level from slipping, tripping and stumbling without subsequent striking against object, initial encounter: Secondary | ICD-10-CM | POA: Diagnosis not present

## 2018-04-23 DIAGNOSIS — Z79899 Other long term (current) drug therapy: Secondary | ICD-10-CM | POA: Diagnosis not present

## 2018-04-23 NOTE — ED Provider Notes (Signed)
[facility] Emergency Department Provider Note ____________________________________________   First MD Initiated Contact with Patient 04/23/18 1023     (approximate)  I have reviewed the triage vital signs and the nursing notes.   HISTORY  Chief Complaint Fall   HPI Rebekah Ball is a 84 y.o. 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  Says that she tripped and fell last night proximate 1 AM while going to the bathroom in her apartment.  She says that she had a right hip.  Denies hitting her head or neck or losing consciousness.  Says that she is having pain to her right hip but is ambulated since the fall.  Says that the pain is only with movement of the right hip and she has been able to ambulate with a rolling walker today.   Past Medical History:  Diagnosis Date  . Anemia     Patient Active Problem List   Diagnosis Date Noted  . Chronic left shoulder pain 02/03/2017  . Xerosis of skin 11/02/2015  . Cognitive and behavioral changes 11/02/2015  . Medicare annual wellness visit, subsequent 02/07/2015  . Do not resuscitate 02/03/2015  . Hypertension 09/28/2011    Past Surgical History:  Procedure Laterality Date  . ABDOMINAL SURGERY    . CESAREAN SECTION     x4  . COLON SURGERY    . FRACTURE SURGERY    . JOINT REPLACEMENT     right hip, bilateral knees    Prior to Admission medications   Medication Sig Start Date End Date Taking? Authorizing Provider  LUMIGAN 0.01 % SOLN Place 1 drop into the left eye at bedtime. 12/19/14   [provider]  meloxicam (MOBIC) 7.5 MG tablet Take 1 tablet (7.5 mg total) by mouth daily. 10/02/17   Crecencio Mc, MD    Allergies Morphine and related and Norvasc [amlodipine besylate]  No family history on file.  Social History Social History   Tobacco Use  . Smoking status: Former Research scientist (life sciences)  . Smokeless tobacco: Never Used  Substance  Use Topics  . Alcohol use: Yes    Comment: glass of wine q 3-4 months  . Drug use: Not on file    Review of Systems  Constitutional: No fever/chills Eyes: No visual changes. ENT: No sore throat. Cardiovascular: Denies chest pain. Respiratory: Denies shortness of breath. Gastrointestinal: No abdominal pain.  No nausea, no vomiting.  No diarrhea.  No constipation. Genitourinary: Negative for dysuria. Musculoskeletal: Negative for back pain. Skin: Negative for rash. Neurological: Negative for headaches, focal weakness or numbness.   ____________________________________________   PHYSICAL EXAM:  VITAL SIGNS: ED Triage Vitals  Enc Vitals Group     BP 04/23/18 0959 (!) 201/77     Pulse Rate 04/23/18 0959 (!) 47     Resp 04/23/18 0959 18     Temp 04/23/18 0959 98.1 F (36.7 C)     Temp Source 04/23/18 0959 Oral     SpO2 04/23/18 0959 95 %     Weight 04/23/18 0959 130 lb (59 kg)     Height 04/23/18 0959 5\' 4"  (1.626 m)     Head Circumference --      Peak Flow --      Pain Score 04/23/18 1004 0     Pain Loc --      Pain Edu? --      Excl. in [place]? --     Constitutional:  Alert and oriented. Well appearing and in no acute distress. Eyes: Conjunctivae are normal.  Head: Atraumatic. Nose: No congestion/rhinnorhea. Mouth/Throat: Mucous membranes are moist.  Neck: No stridor.   Cardiovascular: Normal rate, regular rhythm. Grossly normal heart sounds.  Respiratory: Normal respiratory effort.  No retractions. Lungs CTAB. Gastrointestinal: Soft and nontender. No distention. No CVA tenderness. Musculoskeletal: No lower extremity tenderness nor edema.  No joint effusions.  No tenderness to palpation of the bilateral lower extremities.  Pelvis is stable without any tenderness over the bilateral hips.  However, with active flexion of the right hip the patient has pain over the anterior right hip.  She is neurovascularly intact otherwise with bilateral dorsalis pedis pulses as well as  sensation that is intact to light touch.  Neurologic:  Normal speech and language. No gross focal neurologic deficits are appreciated. Skin:  Skin is warm, dry and intact. No rash noted. Psychiatric: Mood and affect are normal. Speech and behavior are normal.  ____________________________________________   LABS (all labs ordered are listed, but only abnormal results are displayed)  Labs Reviewed - No data to display ____________________________________________  EKG  ED ECG REPORT I, Doran Stabler, the attending physician, personally viewed and interpreted this ECG.   Date: 04/23/2018  EKG Time: 1013  Rate: 77  Rhythm: normal sinus rhythm with bigeminy  Axis: Normal  Intervals:right bundle branch block  ST&T Change: No ST segment elevation or depression.  No abnormal T wave inversion.    ____________________________________________  RADIOLOGY  No acute fracture of the prosthetic right hip or the bone.  No dislocation. ____________________________________________   PROCEDURES  Procedure(s) performed:   Procedures  Critical Care performed:   ____________________________________________   INITIAL IMPRESSION / ASSESSMENT AND PLAN / ED COURSE  Pertinent labs & imaging results that were available during my care of the patient were reviewed by me and considered in my medical decision making (see chart for details).  DDX: Contusion, muscle strain, dislocation, fracture of the right hip As part of my medical decision making, I reviewed the following data within the [place] Notes from prior office visits  ----------------------------------------- 12:04 PM on 04/23/2018 -----------------------------------------  Patient at this time says that she is pain-free.  Reassuring x-rays.  Likely contusion.  Patient will be discharged at this time.  Recommended ice as well as a topical salve such as Aspercreme or icy hot.  Patient understand the diagnosis  as well as treatment and willing to comply.  Unlikely to be occult fracture.  No tenderness to palpation.  Only tender with active range of motion.  On the monitor the patient has intermittent bigeminy.  However, not symptomatic.  Do not believe that the patient has a cardiac cause for the fall that she is having an acute cardiac emergency at this time.  Will be discharged. ____________________________________________   FINAL CLINICAL IMPRESSION(S) / ED DIAGNOSES  Fall.  Hip pain.  NEW MEDICATIONS STARTED DURING THIS VISIT:  New Prescriptions   No medications on file     Note:  This document was prepared using Dragon voice recognition software and may include unintentional dictation errors.     Orbie Pyo, MD 04/23/18 [phone]

## 2018-04-23 NOTE — ED Notes (Signed)
Pt is alert and oriented, but frequently repeating herself. Pt is pleasant and cooperative.

## 2018-04-23 NOTE — ED Triage Notes (Addendum)
Pt arrived via POV with reports of falling around 0100 this morning. Pt lives at [facility] in the [place] area. Pt states she was going back to bed from using the bathroom when she tripped and fell. Pt states she was not using her walker due to being a short distance.  Pt states she was not dizzy when she fell and currently denies any pain.   On arrival, pt was found to have heart rate in the 30s and 40s.

## 2018-04-23 NOTE — ED Notes (Signed)
Patient transported to X-ray 

## 2018-04-23 NOTE — ED Notes (Addendum)
[facility] contacted regarding pt discharge. Facility states they have a driver that will be coming to transport pt back to the facility

## 2018-04-23 NOTE — ED Notes (Signed)
Pt up and able to ambulate to toilet in room with walker. Pt does not appear to be having any difficulties with walking at this time

## 2018-06-05 DIAGNOSIS — Z23 Encounter for immunization: Secondary | ICD-10-CM | POA: Diagnosis not present

## 2018-06-10 DIAGNOSIS — H401122 Primary open-angle glaucoma, left eye, moderate stage: Secondary | ICD-10-CM | POA: Diagnosis not present

## 2018-08-14 ENCOUNTER — Emergency Department: Payer: Medicare Other

## 2018-08-14 ENCOUNTER — Encounter: Payer: Self-pay | Admitting: Emergency Medicine

## 2018-08-14 ENCOUNTER — Other Ambulatory Visit: Payer: Self-pay

## 2018-08-14 ENCOUNTER — Inpatient Hospital Stay
Admission: EM | Admit: 2018-08-14 | Discharge: 2018-08-17 | DRG: 292 | Disposition: A | Discharge status: Home Health | Payer: Medicare Other | Attending: Internal Medicine | Admitting: Internal Medicine

## 2018-08-14 DIAGNOSIS — I350 Nonrheumatic aortic (valve) stenosis: Secondary | ICD-10-CM | POA: Diagnosis not present

## 2018-08-14 DIAGNOSIS — Z96653 Presence of artificial knee joint, bilateral: Secondary | ICD-10-CM | POA: Diagnosis present

## 2018-08-14 DIAGNOSIS — I5041 Acute combined systolic (congestive) and diastolic (congestive) heart failure: Secondary | ICD-10-CM | POA: Diagnosis present

## 2018-08-14 DIAGNOSIS — Z66 Do not resuscitate: Secondary | ICD-10-CM | POA: Diagnosis present

## 2018-08-14 DIAGNOSIS — I2510 Atherosclerotic heart disease of native coronary artery without angina pectoris: Secondary | ICD-10-CM | POA: Diagnosis present

## 2018-08-14 DIAGNOSIS — R7989 Other specified abnormal findings of blood chemistry: Secondary | ICD-10-CM | POA: Diagnosis not present

## 2018-08-14 DIAGNOSIS — R7309 Other abnormal glucose: Secondary | ICD-10-CM | POA: Diagnosis not present

## 2018-08-14 DIAGNOSIS — Z841 Family history of disorders of kidney and ureter: Secondary | ICD-10-CM | POA: Diagnosis not present

## 2018-08-14 DIAGNOSIS — H409 Unspecified glaucoma: Secondary | ICD-10-CM | POA: Diagnosis present

## 2018-08-14 DIAGNOSIS — I110 Hypertensive heart disease with heart failure: Secondary | ICD-10-CM | POA: Diagnosis not present

## 2018-08-14 DIAGNOSIS — I2489 Other forms of acute ischemic heart disease: Secondary | ICD-10-CM

## 2018-08-14 DIAGNOSIS — I509 Heart failure, unspecified: Secondary | ICD-10-CM | POA: Diagnosis not present

## 2018-08-14 DIAGNOSIS — R7301 Impaired fasting glucose: Secondary | ICD-10-CM | POA: Diagnosis not present

## 2018-08-14 DIAGNOSIS — Z885 Allergy status to narcotic agent status: Secondary | ICD-10-CM

## 2018-08-14 DIAGNOSIS — H3530 Unspecified macular degeneration: Secondary | ICD-10-CM | POA: Diagnosis present

## 2018-08-14 DIAGNOSIS — Z96641 Presence of right artificial hip joint: Secondary | ICD-10-CM | POA: Diagnosis present

## 2018-08-14 DIAGNOSIS — R0602 Shortness of breath: Secondary | ICD-10-CM | POA: Diagnosis not present

## 2018-08-14 DIAGNOSIS — Z8249 Family history of ischemic heart disease and other diseases of the circulatory system: Secondary | ICD-10-CM

## 2018-08-14 DIAGNOSIS — I130 Hypertensive heart and chronic kidney disease with heart failure and stage 1 through stage 4 chronic kidney disease, or unspecified chronic kidney disease: Secondary | ICD-10-CM | POA: Diagnosis not present

## 2018-08-14 DIAGNOSIS — J810 Acute pulmonary edema: Secondary | ICD-10-CM | POA: Diagnosis not present

## 2018-08-14 DIAGNOSIS — J189 Pneumonia, unspecified organism: Secondary | ICD-10-CM | POA: Diagnosis not present

## 2018-08-14 DIAGNOSIS — I161 Hypertensive emergency: Secondary | ICD-10-CM | POA: Diagnosis present

## 2018-08-14 DIAGNOSIS — M19019 Primary osteoarthritis, unspecified shoulder: Secondary | ICD-10-CM | POA: Diagnosis present

## 2018-08-14 DIAGNOSIS — Z888 Allergy status to other drugs, medicaments and biological substances status: Secondary | ICD-10-CM | POA: Diagnosis not present

## 2018-08-14 DIAGNOSIS — J90 Pleural effusion, not elsewhere classified: Secondary | ICD-10-CM | POA: Diagnosis not present

## 2018-08-14 DIAGNOSIS — I340 Nonrheumatic mitral (valve) insufficiency: Secondary | ICD-10-CM | POA: Diagnosis not present

## 2018-08-14 DIAGNOSIS — I420 Dilated cardiomyopathy: Secondary | ICD-10-CM | POA: Diagnosis not present

## 2018-08-14 DIAGNOSIS — I248 Other forms of acute ischemic heart disease: Secondary | ICD-10-CM | POA: Diagnosis present

## 2018-08-14 DIAGNOSIS — M1990 Unspecified osteoarthritis, unspecified site: Secondary | ICD-10-CM | POA: Diagnosis present

## 2018-08-14 DIAGNOSIS — Z87891 Personal history of nicotine dependence: Secondary | ICD-10-CM | POA: Diagnosis not present

## 2018-08-14 DIAGNOSIS — I10 Essential (primary) hypertension: Secondary | ICD-10-CM | POA: Diagnosis not present

## 2018-08-14 DIAGNOSIS — I272 Pulmonary hypertension, unspecified: Secondary | ICD-10-CM | POA: Diagnosis present

## 2018-08-14 DIAGNOSIS — R05 Cough: Secondary | ICD-10-CM | POA: Diagnosis not present

## 2018-08-14 DIAGNOSIS — I700 Atherosclerosis of aorta: Secondary | ICD-10-CM | POA: Diagnosis not present

## 2018-08-14 HISTORY — DX: Unspecified osteoarthritis, unspecified site: M19.90

## 2018-08-14 HISTORY — DX: Unspecified macular degeneration: H35.30

## 2018-08-14 LAB — BASIC METABOLIC PANEL
Anion gap: 7 (ref 5–15)
BUN: 22 mg/dL (ref 8–23)
CO2: 22 mmol/L (ref 22–32)
Calcium: 8.7 mg/dL — ABNORMAL LOW (ref 8.9–10.3)
Chloride: 110 mmol/L (ref 98–111)
Creatinine, Ser: 1.22 mg/dL — ABNORMAL HIGH (ref 0.44–1.00)
GFR calc Af Amer: 44 mL/min — ABNORMAL LOW (ref >60)
GFR calc non Af Amer: 38 mL/min — ABNORMAL LOW (ref >60)
Glucose, Bld: 158 mg/dL — ABNORMAL HIGH (ref 70–99)
Potassium: 4.1 mmol/L (ref 3.5–5.1)
Sodium: 139 mmol/L (ref 135–145)

## 2018-08-14 LAB — CBC
HCT: 39.7 % (ref 36.0–46.0)
Hemoglobin: 12.5 g/dL (ref 12.0–15.0)
MCH: 30.3 pg (ref 26.0–34.0)
MCHC: 31.5 g/dL (ref 30.0–36.0)
MCV: 96.1 fL (ref 80.0–100.0)
Platelets: 218 10*3/uL (ref 150–400)
RBC: 4.13 MIL/uL (ref 3.87–5.11)
RDW: 13.9 % (ref 11.5–15.5)
WBC: 6.4 10*3/uL (ref 4.0–10.5)
nRBC: 0 % (ref 0.0–0.2)

## 2018-08-14 LAB — TROPONIN I: Troponin I: 0.05 ng/mL (ref <0.03)

## 2018-08-14 LAB — BRAIN NATRIURETIC PEPTIDE: B Natriuretic Peptide: 2221 pg/mL — ABNORMAL HIGH (ref 0.0–100.0)

## 2018-08-14 MED ORDER — ACETAMINOPHEN 325 MG PO TABS: 650.0000 mg | ORAL_TABLET | Freq: Four times a day (QID) | ORAL | Status: DC | PRN

## 2018-08-14 MED ORDER — LATANOPROST 0.005 % OP SOLN
1.0000 [drp] | Freq: Every day | OPHTHALMIC | Status: DC
  Administered 2018-08-15 – 2018-08-16 (×2): 1 [drp] via OPHTHALMIC
  Filled 2018-08-14 (×2): qty 2.5

## 2018-08-14 MED ORDER — ONDANSETRON HCL 4 MG/2ML IJ SOLN: 4.0000 mg | Freq: Four times a day (QID) | INTRAMUSCULAR | Status: DC | PRN

## 2018-08-14 MED ORDER — NITROGLYCERIN 2 % TD OINT
1.0000 [in_us] | TOPICAL_OINTMENT | Freq: Once | TRANSDERMAL | Status: AC
  Administered 2018-08-14: 1 [in_us] via TOPICAL
  Filled 2018-08-14: qty 1

## 2018-08-14 MED ORDER — ACETAMINOPHEN 650 MG RE SUPP: 650.0000 mg | Freq: Four times a day (QID) | RECTAL | Status: DC | PRN

## 2018-08-14 MED ORDER — ONDANSETRON HCL 4 MG PO TABS: 4.0000 mg | ORAL_TABLET | Freq: Four times a day (QID) | ORAL | Status: DC | PRN

## 2018-08-14 MED ORDER — ENOXAPARIN SODIUM 40 MG/0.4ML ~~LOC~~ SOLN
30.0000 mg | SUBCUTANEOUS | Status: DC
  Administered 2018-08-15 – 2018-08-16 (×2): 30 mg via SUBCUTANEOUS
  Filled 2018-08-14 (×2): qty 0.4

## 2018-08-14 MED ORDER — NITROGLYCERIN 2 % TD OINT
0.5000 [in_us] | TOPICAL_OINTMENT | Freq: Four times a day (QID) | TRANSDERMAL | Status: DC
  Administered 2018-08-14 – 2018-08-16 (×5): 0.5 [in_us] via TOPICAL
  Filled 2018-08-14 (×6): qty 1

## 2018-08-14 MED ORDER — MELOXICAM 7.5 MG PO TABS
7.5000 mg | ORAL_TABLET | Freq: Every day | ORAL | Status: DC
  Administered 2018-08-15 – 2018-08-17 (×3): 7.5 mg via ORAL
  Filled 2018-08-14 (×3): qty 1

## 2018-08-14 MED ORDER — FUROSEMIDE 10 MG/ML IJ SOLN
40.0000 mg | Freq: Once | INTRAMUSCULAR | Status: AC
  Administered 2018-08-14: 40 mg via INTRAVENOUS
  Filled 2018-08-14: qty 4

## 2018-08-14 MED ORDER — HYDRALAZINE HCL 20 MG/ML IJ SOLN
10.0000 mg | Freq: Once | INTRAMUSCULAR | Status: AC
  Administered 2018-08-14: 10 mg via INTRAVENOUS
  Filled 2018-08-14: qty 1

## 2018-08-14 MED ORDER — FUROSEMIDE 10 MG/ML IJ SOLN
20.0000 mg | Freq: Two times a day (BID) | INTRAMUSCULAR | Status: DC
  Administered 2018-08-15 – 2018-08-17 (×5): 20 mg via INTRAVENOUS
  Filled 2018-08-14 (×5): qty 2

## 2018-08-14 MED ORDER — METOPROLOL TARTRATE 25 MG PO TABS
12.5000 mg | ORAL_TABLET | Freq: Two times a day (BID) | ORAL | Status: DC
  Administered 2018-08-14 – 2018-08-15 (×2): 12.5 mg via ORAL
  Filled 2018-08-14 (×2): qty 1

## 2018-08-14 NOTE — ED Notes (Signed)
Patient transported to X-ray 

## 2018-08-14 NOTE — ED Provider Notes (Signed)
[facility] Emergency Department Provider Note  Time seen: 8:33 PM  I have reviewed the triage vital signs and the nursing notes.   HISTORY  Chief Complaint Shortness of Breath    HPI Rebekah Ball is a 85 y.o. female with a past medical history of anemia, hypertension, presents to the emergency department for shortness of breath.  According to the patient since last night she has been feeling short of breath, they were visiting in [place] they went to an urgent care today and was diagnosed with likely pneumonia.  Given a shot of antibiotics and she believes a shot of steroids was discharged home [place], patient states he wanted to come back to [place] which is where she lives.  Upon getting to [facility] they came to the ER for evaluation.  Patient continues to feel short of breath.  Daughter states it is very intermittent at times patient will be breathing quickly and heavily another time she appears normal.  They gave her breathing treatments at urgent care.  States her breathing is improved but not 100% better.  They deny any fever.  They do state increased cough but denies any sputum production.  Denies any chest pain.   Past Medical History:  Diagnosis Date  . Anemia   . Arthritis     Patient Active Problem List   Diagnosis Date Noted  . Chronic left shoulder pain 02/03/2017  . Xerosis of skin 11/02/2015  . Cognitive and behavioral changes 11/02/2015  . Medicare annual wellness visit, subsequent 02/07/2015  . Do not resuscitate 02/03/2015  . Hypertension 09/28/2011    Past Surgical History:  Procedure Laterality Date  . ABDOMINAL SURGERY    . CESAREAN SECTION     x4  . COLON SURGERY    . FRACTURE SURGERY    . JOINT REPLACEMENT     right hip, bilateral knees    Prior to Admission medications   Medication Sig Start Date End Date Taking? Authorizing Provider  LUMIGAN 0.01 % SOLN Place 1 drop into the left eye at bedtime.  12/19/14   [provider]  meloxicam (MOBIC) 7.5 MG tablet Take 1 tablet (7.5 mg total) by mouth daily. 10/02/17   Crecencio Mc, MD    Allergies  Allergen Reactions  . Norvasc [Amlodipine Besylate] Swelling  . Morphine And Related Rash    No family history on file.  Social History Social History   Tobacco Use  . Smoking status: Former Research scientist (life sciences)  . Smokeless tobacco: Never Used  Substance Use Topics  . Alcohol use: Yes    Comment: glass of wine q 3-4 months  . Drug use: Never    Review of Systems Constitutional: Negative for fever. Cardiovascular: Negative for chest pain. Respiratory: Positive for shortness of breath.  Positive for cough. Gastrointestinal: Negative for abdominal pain, vomiting  Genitourinary: Negative for urinary compaints Musculoskeletal: Negative for leg pain or swelling Skin: Negative for skin complaints  Neurological: Negative for headache All other ROS negative  ____________________________________________   PHYSICAL EXAM:  VITAL SIGNS: ED Triage Vitals  Enc Vitals Group     BP 08/14/18 1902 (!) 223/122     Pulse Rate 08/14/18 1902 95     Resp 08/14/18 1902 20     Temp 08/14/18 1902 98.3 F (36.8 C)     Temp Source 08/14/18 1902 Oral     SpO2 08/14/18 1902 96 %     Weight 08/14/18 1903 135 lb (61.2 kg)  Height 08/14/18 1903 5\' 2"  (1.575 m)     Head Circumference --      Peak Flow --      Pain Score 08/14/18 1903 0     Pain Loc --      Pain Edu? --      Excl. in [place]? --    Constitutional: Alert and oriented. Well appearing and in no distress. Eyes: Normal exam ENT   Head: Normocephalic and atraumatic.   Mouth/Throat: Mucous membranes are moist. Cardiovascular: Normal rate, regular rhythm. No murmur Respiratory: Mild tachypnea but clear breath sounds bilaterally.  No wheeze rales or rhonchi. Gastrointestinal: Soft and nontender. No distention.  Musculoskeletal: Nontender with normal range of motion in all  extremities. No lower extremity tenderness or edema. Neurologic:  Normal speech and language. No gross focal neurologic deficits  Skin:  Skin is warm, dry and intact.  Psychiatric: Mood and affect are normal.   ____________________________________________    EKG  EKG viewed and interpreted by myself shows a sinus rhythm at 74 bpm with a narrow QRS, normal axis, largely normal intervals, no concerning ST changes.  Occasional PVCs.  ____________________________________________    RADIOLOGY  Chest x-ray shows mild CHF with market cardiomegaly and minimal interstitial pulmonary edema.  ____________________________________________   INITIAL IMPRESSION / ASSESSMENT AND PLAN / ED COURSE  Pertinent labs & imaging results that were available during my care of the patient were reviewed by me and considered in my medical decision making (see chart for details).  Patient presents to the emergency department for shortness of breath.  Patient is noted to be quite hypertensive currently systolic of 970.  Was told she likely had pneumonia at the urgent care but patient wanted to leave to come back to [place] so she left this morning.  Differential this time would include pneumonia, URI, CHF, COPD.  Patient has clear lung sounds currently but daughter states they gave the patient breathing treatments at urgent care.  Patient's labs show a slight troponin elevation 0.05.  Chest x-ray shows CHF with interstitial edema.  Given the patient's elevated blood pressure this likely represents CHF exacerbation/pulmonary edema.  We will dose 10 mg of IV hydralazine as well as dose topical nitroglycerin ointment and continue to closely monitor.  We will obtain CT imaging to more closely evaluate.  CT consistent pulmonary edema.  Patient's BNP is elevated to 2200.  Given the patient's reported shortness of breath, her age and CT findings we will admit to the hospitalist service for further treatment.  Patient  has significant hypertension given IV hydralazine, 1 inch of nitroglycerin paste.  Dosed IV Lasix.  CRITICAL CARE Performed by: Harvest Dark   Total critical care time: 30 minutes  Critical care time was exclusive of separately billable procedures and treating other patients.  Critical care was necessary to treat or prevent imminent or life-threatening deterioration.  Critical care was time spent personally by me on the following activities: development of treatment plan with patient and/or surrogate as well as nursing, discussions with consultants, evaluation of patient's response to treatment, examination of patient, obtaining history from patient or surrogate, ordering and performing treatments and interventions, ordering and review of laboratory studies, ordering and review of radiographic studies, pulse oximetry and re-evaluation of patient's condition.   ____________________________________________   FINAL CLINICAL IMPRESSION(S) / ED DIAGNOSES  Hypertensive emergency CHF exacerbation Pulmonary edema   Harvest Dark, MD 08/14/18 2139

## 2018-08-14 NOTE — ED Notes (Signed)
ED TO INPATIENT HANDOFF REPORT  Name/Age/Gender Rebekah Ball 85 y.o. female  Code Status    Code Status Orders  (From admission, onward)         Start     Ordered   08/14/18 2154  Do not attempt resuscitation (DNR)  Continuous    Question Answer Comment  In the event of cardiac or respiratory ARREST Do not call a "code blue"   In the event of cardiac or respiratory ARREST Do not perform Intubation, CPR, defibrillation or ACLS   In the event of cardiac or respiratory ARREST Use medication by any route, position, wound care, and other measures to relive pain and suffering. May use oxygen, suction and manual treatment of airway obstruction as needed for comfort.   Comments nurse may pronounce      08/14/18 2154        Code Status History    This patient has a current code status but no historical code status.    Advance Directive Documentation     Most Recent Value  Type of Advance Directive  Healthcare Power of Attorney  Pre-existing out of facility DNR order (yellow form or pink MOST form)  -  "MOST" Form in Place?  -      Home/SNF/Other   Chief Complaint SOB   Level of Care/Admitting Diagnosis ED Disposition    ED Disposition Condition [place]: [place] [[id number]]  Level of Care: Telemetry [5]  Diagnosis: CHF (congestive heart failure) [facility]) [[id number]]  Admitting Physician: Loletha Grayer [[id number]]  Attending Physician: Loletha Grayer [phone]  Estimated length of stay: past midnight tomorrow  Certification:: I certify this patient will need inpatient services for at least 2 midnights  PT Class (Do Not Modify): Inpatient [101]  PT Acc Code (Do Not Modify): Private [1]       Medical History Past Medical History:  Diagnosis Date  . Anemia   . Arthritis   . Macular degeneration     Allergies Allergies  Allergen Reactions  . Norvasc [Amlodipine Besylate] Swelling  . Morphine And Related Rash    IV  Location/Drains/Wounds Patient Lines/Drains/Airways Status   Active Line/Drains/Airways    Name:   Placement date:   Placement time:   Site:   Days:   Peripheral IV 08/14/18 Right Antecubital   08/14/18    2136    Antecubital   less than 1          Labs/Imaging Results for orders placed or performed during the hospital encounter of 08/14/18 (from the past 48 hour(s))  Basic metabolic panel     Status: Abnormal   Collection Time: 08/14/18  7:08 PM  Result Value Ref Range   Sodium 139 135 - 145 mmol/L   Potassium 4.1 3.5 - 5.1 mmol/L   Chloride 110 98 - 111 mmol/L   CO2 22 22 - 32 mmol/L   Glucose, Bld 158 (H) 70 - 99 mg/dL   BUN 22 8 - 23 mg/dL   Creatinine, Ser 1.22 (H) 0.44 - 1.00 mg/dL   Calcium 8.7 (L) 8.9 - 10.3 mg/dL   GFR calc non Af Amer 38 (L) >60 mL/min   GFR calc Af Amer 44 (L) >60 mL/min   Anion gap 7 5 - 15    Comment: Performed at [facility], [address], [place], [place] [address]  CBC     Status: None   Collection Time: 08/14/18  7:08 PM  Result  Value Ref Range   WBC 6.4 4.0 - 10.5 K/uL   RBC 4.13 3.87 - 5.11 MIL/uL   Hemoglobin 12.5 12.0 - 15.0 g/dL   HCT 39.7 36.0 - 46.0 %   MCV 96.1 80.0 - 100.0 fL   MCH 30.3 26.0 - 34.0 pg   MCHC 31.5 30.0 - 36.0 g/dL   RDW 13.9 11.5 - 15.5 %   Platelets 218 150 - 400 K/uL   nRBC 0.0 0.0 - 0.2 %    Comment: Performed at [facility], [address] [place], [place] [address]  Troponin I - ONCE - STAT     Status: Abnormal   Collection Time: 08/14/18  7:08 PM  Result Value Ref Range   Troponin I 0.05 (HH) <0.03 ng/mL    Comment: CRITICAL RESULT CALLED TO, READ BACK BY AND VERIFIED WITH BUTCH WOODS @1955  08/14/2018 AKT Performed at [facility], [address], [place], [place] [address]   Brain natriuretic peptide     Status: Abnormal   Collection Time: 08/14/18  7:08 PM  Result Value Ref Range   B Natriuretic Peptide 2,221.0 (H) 0.0 - 100.0 pg/mL    Comment: Performed at [facility], [address], [place], [place] [address]   Dg Chest 2 View  Result Date: 08/14/2018 CLINICAL DATA:  85 year old with acute onset of shortness of breath. EXAM: CHEST - 2 VIEW COMPARISON:  04/10/2014 and earlier, including CT chest 07/15/2008. FINDINGS: AP ERECT and LATERAL images were obtained. Cardiac silhouette moderately to markedly enlarged for AP technique, significantly increased in size since 2015. Thoracic aorta tortuous and atherosclerotic, unchanged. Hilar and mediastinal contours otherwise unremarkable. Stable RIGHT apicolateral pleural thickening, possibly a subpleural lipoma. Pulmonary venous hypertension and minimal interstitial pulmonary edema as there are Kerley B lines in both lungs. Small BILATERAL pleural effusions with associated mild consolidation in the lower lobes. Severe osseous demineralization. Exaggeration of the usual thoracic kyphosis. Thoracic dextroscoliosis and thoracolumbar levoscoliosis. IMPRESSION: Mild CHF, with moderate to marked cardiomegaly and minimal interstitial pulmonary edema. Small BILATERAL pleural effusions with associated passive atelectasis in the lower lobes. Electronically Signed   By: Evangeline Dakin M.D.   On: 08/14/2018 19:41   Ct Chest Wo Contrast  Result Date: 08/14/2018 CLINICAL DATA:  Dyspnea EXAM: CT CHEST WITHOUT CONTRAST TECHNIQUE: Multidetector CT imaging of the chest was performed following the standard protocol without IV contrast. COMPARISON:  Chest x-ray 08/14/2018, CT chest 07/15/2008 FINDINGS: Cardiovascular: Limited evaluation without intravenous contrast. Moderate severe aortic atherosclerosis. Ectatic ascending segment, measuring up to 3.4 cm. Coronary vascular calcification. Mild to moderate cardiomegaly. Trace pericardial effusion. Enlarged pulmonary trunk, measuring up to 3.7 cm. Mediastinum/Nodes: Midline trachea. Subcentimeter hypodense nodule left lobe of thyroid. Subcentimeter lymph nodes in the mediastinum. The  esophagus is within normal limits. Lungs/Pleura: Small bilateral pleural effusions, right greater than left. Septal thickening within the bilateral lung bases, likely interstitial edema. Upper Abdomen: No acute abnormality. Musculoskeletal: Degenerative changes of the spine without acute or suspicious abnormality IMPRESSION: 1. Cardiomegaly with small bilateral right greater than left pleural effusions. Septal thickening within the bilateral lung bases, suspect interstitial edema. 2. Enlarged pulmonary trunk, suggesting pulmonary hypertension. Aortic Atherosclerosis (ICD10-I70.0). Electronically Signed   By: Donavan Foil M.D.   On: 08/14/2018 21:21    Pending Labs Unresulted Labs (From admission, onward)    Start     Ordered   08/21/18 0500  Creatinine, serum  (enoxaparin (LOVENOX)    CrCl >/= 30 ml/min)  Weekly,  STAT    Comments:  while on enoxaparin therapy    08/14/18 2154   08/15/18 0932  Basic metabolic panel  Tomorrow morning,   STAT     08/14/18 2154   08/15/18 0500  CBC  Tomorrow morning,   STAT     08/14/18 2154   08/15/18 0500  Troponin I - Tomorrow AM 0500  Tomorrow morning,   STAT     08/14/18 2157   08/14/18 2208  Hemoglobin A1c  Add-on,   AD     08/14/18 2207   08/14/18 2200  Troponin I - Once  Once,   STAT     08/14/18 2157   08/14/18 2153  Influenza panel by PCR (type A & B)  (Influenza PCR Panel)  Once,   STAT     08/14/18 2153          Vitals/Pain Today's Vitals   08/14/18 1902 08/14/18 1903 08/14/18 2140 08/14/18 2208  BP: (!) 223/122  (!) 187/109 (!) 150/70  Pulse: 95   84  Resp: 20   (!) 30  Temp: 98.3 F (36.8 C)     TempSrc: Oral     SpO2: 96%   96%  Weight:  61.2 kg    Height:  5\' 2"  (1.575 m)    PainSc:  0-No pain      Isolation Precautions Droplet precaution  Medications Medications  enoxaparin (LOVENOX) injection 30 mg (has no administration in time range)  acetaminophen (TYLENOL) tablet 650 mg (has no administration in time range)    Or   acetaminophen (TYLENOL) suppository 650 mg (has no administration in time range)  ondansetron (ZOFRAN) tablet 4 mg (has no administration in time range)    Or  ondansetron (ZOFRAN) injection 4 mg (has no administration in time range)  meloxicam (MOBIC) tablet 7.5 mg (has no administration in time range)  latanoprost (XALATAN) 0.005 % ophthalmic solution 1 drop (has no administration in time range)  metoprolol tartrate (LOPRESSOR) tablet 12.5 mg (has no administration in time range)  furosemide (LASIX) injection 20 mg (has no administration in time range)  nitroGLYCERIN (NITROGLYN) 2 % ointment 0.5 inch (has no administration in time range)  nitroGLYCERIN (NITROGLYN) 2 % ointment 1 inch (1 inch Topical Given 08/14/18 2140)  hydrALAZINE (APRESOLINE) injection 10 mg (10 mg Intravenous Given 08/14/18 2140)  furosemide (LASIX) injection 40 mg (40 mg Intravenous Given 08/14/18 2200)    Mobility Fall risk

## 2018-08-14 NOTE — ED Triage Notes (Signed)
Pt in via POV, pt is resident at [facility], was seen at urgent care in [place], [place] (pt visiting family) today, advised to be evaluated in ER due to shortness of breath and potential PNA.  Per paperwork, pt received 1 duoneb, 1g Rocephin injection, 80mg  Kenalog injection.  Pt hypertensive upon arrival, other vitals WDL, NAD noted at this time.

## 2018-08-14 NOTE — H&P (Signed)
[facility] [place] NAME: Rebekah Ball    MR#:  [id number]  DATE OF BIRTH:  Oct 03, 1920  DATE OF ADMISSION:  08/14/2018  PRIMARY CARE PHYSICIAN: Crecencio Mc, MD   REQUESTING/REFERRING PHYSICIAN: Dr Harvest Dark  CHIEF COMPLAINT:   Chief Complaint  Patient presents with  . Shortness of Breath    HISTORY OF PRESENT ILLNESS:  Rebekah Ball  is a 85 y.o. female presents with shortness of breath to urgent care in [place].  They told her she likely has pneumonia and wanted to send her into the hospital but the patient did not want to go to the hospital in [place].  She wanted to come home.  Her family put her on oxygen and drove her home.  She is having symptoms of shortness of breath going on for the last 36 hours.  She has had a raspy voice and some wheezing.  She did not sleep well last night.  She has been having a dry cough.  She has been short of breath.  In the ER she was found to be in CHF with a high BNP and chest x-ray and CT scan concerning for CHF.  Hospitalist services contacted for further evaluation.  PAST MEDICAL HISTORY:   Past Medical History:  Diagnosis Date  . Anemia   . Arthritis   . Macular degeneration     PAST SURGICAL HISTORY:   Past Surgical History:  Procedure Laterality Date  . ABDOMINAL HYSTERECTOMY    . ABDOMINAL SURGERY    . BLADDER SURGERY    . CARPAL TUNNEL RELEASE    . CESAREAN SECTION     x4  . COLON SURGERY    . FRACTURE SURGERY    . JOINT REPLACEMENT     right hip, bilateral knees  . TRIGGER FINGER RELEASE      SOCIAL HISTORY:   Social History   Tobacco Use  . Smoking status: Former Research scientist (life sciences)  . Smokeless tobacco: Never Used  Substance Use Topics  . Alcohol use: Yes    Comment: glass of wine q 3-4 months    FAMILY HISTORY:   Family History  Problem Relation Age of Onset  . Kidney disease Mother   . CAD Father     DRUG ALLERGIES:   Allergies  Allergen Reactions  .  Norvasc [Amlodipine Besylate] Swelling  . Morphine And Related Rash    REVIEW OF SYSTEMS:  CONSTITUTIONAL: No fever, chills or sweats.  Positive for fatigue.  EYES: Wears glasses and has macular degeneration EARS, NOSE, AND THROAT: No tinnitus or ear pain. No sore throat.  Decreased hearing. RESPIRATORY: Positive for dry cough.  Positive for shortness of breath.  No wheezing or hemoptysis.  CARDIOVASCULAR: No chest pain, orthopnea, edema.  GASTROINTESTINAL: No nausea, vomiting, diarrhea or abdominal pain. No blood in bowel movements GENITOURINARY: No dysuria, hematuria.  ENDOCRINE: No polyuria, nocturia,  HEMATOLOGY: No anemia, easy bruising or bleeding SKIN: No rash or lesion. MUSCULOSKELETAL: Positive for left shoulder pain NEUROLOGIC: No tingling, numbness, weakness.  PSYCHIATRY: No anxiety or depression.   MEDICATIONS AT HOME:   Prior to Admission medications   Medication Sig Start Date End Date Taking? Authorizing Provider  albuterol (PROVENTIL HFA;VENTOLIN HFA) 108 (90 Base) MCG/ACT inhaler Inhale 1-2 puffs into the lungs every 4 (four) hours as needed for wheezing or shortness of breath.   Yes [provider]  azithromycin (ZITHROMAX) 500 MG tablet Take 500 mg by mouth daily. For 3  days   Yes [provider]  LUMIGAN 0.01 % SOLN Place 1 drop into the left eye at bedtime. 12/19/14  Yes [provider]  meloxicam (MOBIC) 7.5 MG tablet Take 1 tablet (7.5 mg total) by mouth daily. 10/02/17  Yes Crecencio Mc, MD      VITAL SIGNS:  Blood pressure (!) 187/109, pulse 95, temperature 98.3 F (36.8 C), temperature source Oral, resp. rate 20, height 5\' 2"  (1.575 m), weight 61.2 kg, SpO2 96 %.  PHYSICAL EXAMINATION:  GENERAL:  85 y.o.-year-old patient lying in the bed with no acute distress.  EYES: Pupils equal, round, reactive to light and accommodation. No scleral icterus. Extraocular muscles intact.  HEENT: Head atraumatic, normocephalic. Oropharynx and  nasopharynx clear.  NECK:  Supple, no jugular venous distention. No thyroid enlargement, no tenderness.  LUNGS: Decreased breath sounds bilateral, upper airway slight expiratory wheezing.  Positive rales bilateral bases. No use of accessory muscles of respiration.  CARDIOVASCULAR: S1, S2 normal. No murmurs, rubs, or gallops.  ABDOMEN: Soft, nontender, nondistended. Bowel sounds present. No organomegaly or mass.  EXTREMITIES: No pedal edema, cyanosis, or clubbing.  NEUROLOGIC: Cranial nerves II through XII are intact. Muscle strength 5/5 in all extremities. Sensation intact. Gait not checked.  PSYCHIATRIC: The patient is alert and oriented x 3.  SKIN: No rash, lesion, or ulcer.   LABORATORY PANEL:   CBC Recent Labs  Lab 08/14/18 1908  WBC 6.4  HGB 12.5  HCT 39.7  PLT 218   ------------------------------------------------------------------------------------------------------------------  Chemistries  Recent Labs  Lab 08/14/18 1908  NA 139  K 4.1  CL 110  CO2 22  GLUCOSE 158*  BUN 22  CREATININE 1.22*  CALCIUM 8.7*   ------------------------------------------------------------------------------------------------------------------  Cardiac Enzymes Recent Labs  Lab 08/14/18 1908  TROPONINI 0.05*   ------------------------------------------------------------------------------------------------------------------  RADIOLOGY:  Dg Chest 2 View  Result Date: 08/14/2018 CLINICAL DATA:  85 year old with acute onset of shortness of breath. EXAM: CHEST - 2 VIEW COMPARISON:  04/10/2014 and earlier, including CT chest 07/15/2008. FINDINGS: AP ERECT and LATERAL images were obtained. Cardiac silhouette moderately to markedly enlarged for AP technique, significantly increased in size since 2015. Thoracic aorta tortuous and atherosclerotic, unchanged. Hilar and mediastinal contours otherwise unremarkable. Stable RIGHT apicolateral pleural thickening, possibly a subpleural lipoma. Pulmonary  venous hypertension and minimal interstitial pulmonary edema as there are Kerley B lines in both lungs. Small BILATERAL pleural effusions with associated mild consolidation in the lower lobes. Severe osseous demineralization. Exaggeration of the usual thoracic kyphosis. Thoracic dextroscoliosis and thoracolumbar levoscoliosis. IMPRESSION: Mild CHF, with moderate to marked cardiomegaly and minimal interstitial pulmonary edema. Small BILATERAL pleural effusions with associated passive atelectasis in the lower lobes. Electronically Signed   By: Evangeline Dakin M.D.   On: 08/14/2018 19:41   Ct Chest Wo Contrast  Result Date: 08/14/2018 CLINICAL DATA:  Dyspnea EXAM: CT CHEST WITHOUT CONTRAST TECHNIQUE: Multidetector CT imaging of the chest was performed following the standard protocol without IV contrast. COMPARISON:  Chest x-ray 08/14/2018, CT chest 07/15/2008 FINDINGS: Cardiovascular: Limited evaluation without intravenous contrast. Moderate severe aortic atherosclerosis. Ectatic ascending segment, measuring up to 3.4 cm. Coronary vascular calcification. Mild to moderate cardiomegaly. Trace pericardial effusion. Enlarged pulmonary trunk, measuring up to 3.7 cm. Mediastinum/Nodes: Midline trachea. Subcentimeter hypodense nodule left lobe of thyroid. Subcentimeter lymph nodes in the mediastinum. The esophagus is within normal limits. Lungs/Pleura: Small bilateral pleural effusions, right greater than left. Septal thickening within the bilateral lung bases, likely interstitial edema. Upper Abdomen: No acute abnormality. Musculoskeletal:  Degenerative changes of the spine without acute or suspicious abnormality IMPRESSION: 1. Cardiomegaly with small bilateral right greater than left pleural effusions. Septal thickening within the bilateral lung bases, suspect interstitial edema. 2. Enlarged pulmonary trunk, suggesting pulmonary hypertension. Aortic Atherosclerosis (ICD10-I70.0). Electronically Signed   By: Donavan Foil  M.D.   On: 08/14/2018 21:21    EKG:   Normal sinus rhythm 74 bpm, LVH and PVCs  IMPRESSION AND PLAN:   1.  Acute congestive heart failure.  Lasix 40 mg IV x1 ordered in the ED.  We will continue Lasix 20 mg IV twice daily.  Start metoprolol 12.5 mg twice daily.  Patient was ordered a nitroglycerin patch. 2.  Accelerated hypertension.  Unclear if this is a true value because the patient is tensing up so much with the automated blood pressure cuff.  I asked the nurse to take a manual blood pressure.  The patient was given IV hydralazine.  Also given Nitropaste.  I do not want to continue IV hydralazine with the Nitropaste at this time.  Start oral metoprolol. 3.  Impaired fasting glucose check a hemoglobin A1c 4.  Borderline elevated troponin likely demand ischemia from acute congestive heart failure and accelerated hypertension 5.  Macular degeneration and glaucoma on eyedrops at night 6.  Arthritis of the shoulder on meloxicam   All the records are reviewed and case discussed with ED provider. Management plans discussed with the patient, family and they are in agreement.  CODE STATUS: full code  TOTAL TIME TAKING CARE OF THIS PATIENT: 22minutes, including acp time    Loletha Grayer M.D on 08/14/2018 at 9:58 PM  Between 7am to 6pm - Pager - [phone]  After 6pm call admission pager [phone]  [facility]  [phone]  CC: Primary care physician; Crecencio Mc, MD

## 2018-08-14 NOTE — Progress Notes (Addendum)
Patient ID: Rebekah Ball, female   DOB: [birth year], 85 y.o.   MRN: [id number]  ACP note  Patient, son and daughter-in-law at the bedside  Diagnosis: Acute congestive heart failure, accelerated hypertension, impaired fasting glucose, borderline elevated troponin, macular degeneration and glaucoma, arthritis  CODE STATUS discussed and patient is a DO NOT RESUSCITATE  Plan.  Diuresis with IV Lasix.  Check an echocardiogram.  Cardiology consultation.  Manual blood pressure readings and obtain a better blood pressure.  Time spent on ACP discussion 17 minutes Dr. Loletha Grayer

## 2018-08-14 NOTE — ED Triage Notes (Signed)
FIRST NURSE NOTE-has had some SHOB and cough. Family worried about PNA.  Family had extra oxygen tank so put pt on it. They report her sats were 44 and they never let her sats get below 92. Family states "my mother has COPD so I am well aware of oxygen sats".

## 2018-08-15 ENCOUNTER — Inpatient Hospital Stay (HOSPITAL_COMMUNITY)
Admit: 2018-08-15 | Discharge: 2018-08-15 | Disposition: A | Discharge status: Home / Self Care | Payer: Medicare Other | Attending: Internal Medicine | Admitting: Internal Medicine

## 2018-08-15 DIAGNOSIS — I10 Essential (primary) hypertension: Secondary | ICD-10-CM

## 2018-08-15 DIAGNOSIS — I5041 Acute combined systolic (congestive) and diastolic (congestive) heart failure: Secondary | ICD-10-CM

## 2018-08-15 DIAGNOSIS — I420 Dilated cardiomyopathy: Secondary | ICD-10-CM

## 2018-08-15 DIAGNOSIS — I340 Nonrheumatic mitral (valve) insufficiency: Secondary | ICD-10-CM

## 2018-08-15 DIAGNOSIS — R7309 Other abnormal glucose: Secondary | ICD-10-CM

## 2018-08-15 DIAGNOSIS — I350 Nonrheumatic aortic (valve) stenosis: Secondary | ICD-10-CM

## 2018-08-15 DIAGNOSIS — I110 Hypertensive heart disease with heart failure: Principal | ICD-10-CM

## 2018-08-15 LAB — CBC
HCT: 36.1 % (ref 36.0–46.0)
HEMOGLOBIN: 11.8 g/dL — AB (ref 12.0–15.0)
MCH: 30.7 pg (ref 26.0–34.0)
MCHC: 32.7 g/dL (ref 30.0–36.0)
MCV: 94 fL (ref 80.0–100.0)
Platelets: 218 10*3/uL (ref 150–400)
RBC: 3.84 MIL/uL — ABNORMAL LOW (ref 3.87–5.11)
RDW: 14 % (ref 11.5–15.5)
WBC: 7.3 10*3/uL (ref 4.0–10.5)
nRBC: 0 % (ref 0.0–0.2)

## 2018-08-15 LAB — ECHOCARDIOGRAM COMPLETE
Height: 62 in
Weight: 2345.34 oz

## 2018-08-15 LAB — BASIC METABOLIC PANEL
Anion gap: 8 (ref 5–15)
BUN: 22 mg/dL (ref 8–23)
CO2: 22 mmol/L (ref 22–32)
Calcium: 8.6 mg/dL — ABNORMAL LOW (ref 8.9–10.3)
Chloride: 108 mmol/L (ref 98–111)
Creatinine, Ser: 1.17 mg/dL — ABNORMAL HIGH (ref 0.44–1.00)
GFR calc Af Amer: 46 mL/min — ABNORMAL LOW (ref >60)
GFR calc non Af Amer: 40 mL/min — ABNORMAL LOW (ref >60)
Glucose, Bld: 119 mg/dL — ABNORMAL HIGH (ref 70–99)
Potassium: 3.9 mmol/L (ref 3.5–5.1)
Sodium: 138 mmol/L (ref 135–145)

## 2018-08-15 LAB — HEMOGLOBIN A1C
Hgb A1c MFr Bld: 5.1 % (ref 4.8–5.6)
Mean Plasma Glucose: 99.67 mg/dL

## 2018-08-15 LAB — INFLUENZA PANEL BY PCR (TYPE A & B)
Influenza A By PCR: NEGATIVE
Influenza B By PCR: NEGATIVE

## 2018-08-15 LAB — TROPONIN I
TROPONIN I: 0.05 ng/mL — AB (ref <0.03)
TROPONIN I: 0.06 ng/mL — AB (ref <0.03)

## 2018-08-15 LAB — MRSA PCR SCREENING: MRSA BY PCR: NEGATIVE

## 2018-08-15 MED ORDER — SODIUM CHLORIDE 0.9% FLUSH
3.0000 mL | Freq: Two times a day (BID) | INTRAVENOUS | Status: DC
  Administered 2018-08-15 – 2018-08-17 (×5): 3 mL via INTRAVENOUS

## 2018-08-15 MED ORDER — METOPROLOL SUCCINATE ER 25 MG PO TB24: 25.0000 mg | ORAL_TABLET | Freq: Every day | ORAL | Status: DC

## 2018-08-15 MED ORDER — ORAL CARE MOUTH RINSE
15.0000 mL | Freq: Two times a day (BID) | OROMUCOSAL | Status: DC
  Administered 2018-08-15 – 2018-08-17 (×5): 15 mL via OROMUCOSAL

## 2018-08-15 MED ORDER — METOPROLOL SUCCINATE ER 50 MG PO TB24
50.0000 mg | ORAL_TABLET | Freq: Every day | ORAL | Status: DC
  Administered 2018-08-16 – 2018-08-17 (×2): 50 mg via ORAL
  Filled 2018-08-15 (×2): qty 1

## 2018-08-15 MED ORDER — LOSARTAN POTASSIUM 50 MG PO TABS
50.0000 mg | ORAL_TABLET | Freq: Every day | ORAL | Status: DC
  Administered 2018-08-15 – 2018-08-16 (×2): 50 mg via ORAL
  Filled 2018-08-15 (×2): qty 1

## 2018-08-15 NOTE — Progress Notes (Signed)
*  PRELIMINARY RESULTS* Echocardiogram 2D Echocardiogram has been performed.  Rebekah Ball 08/15/2018, 11:45 AM

## 2018-08-15 NOTE — Plan of Care (Signed)
Breathing improving

## 2018-08-15 NOTE — Consult Note (Signed)
Cardiology Consultation:   Patient ID: Rebekah Ball MRN: [id number]; DOB: [birth year]  Admit date: 08/14/2018 Date of Consult: 08/15/2018  Primary Care Provider: Crecencio Mc, MD Primary Cardiologist:[facility], Dr. Rockey Situ Primary Electrophysiologist:  None    Patient Profile:   Rebekah Ball is a 85 y.o. female with a hx of hypertension and anemia who is being seen today for the evaluation of CHF at the request Dr. Leslye Peer.  History of Present Illness:   Rebekah Ball is a 85 year old female with PMH as above and no known cardiac history.  She reportedly is a resident at [facility].  She stated that she does not eat  salt diet but does report a relatively poor diet as she frequently does eat out. When at [facility], she frequently also eats at [employer] and drinks ice tea and coffee.  She denied any trouble with lying flat at night or shortness of breath with ambulation leading up to this admission.  She does, however, report reduced appetite; however, she attributes this to increased age.  She ambulates with a walker and is s/p right hip and bilateral knee replacement.  On 08/13/2018, the patient reportedly started to feel short of breath while visiting her son in [place].  She stated, during that time, she was eating out at restaurants more than usual with her son as she wanted to treat him.  On 08/14/2018, the patient went to urgent care for her SOB and was diagnosed with likely PNA. She received what she felt to be a shot of antibiotics and steriods and was discharged home after breathing treatments and [place]; however, she reportedly wanted to come back to [facility] for [place].  Upon returning to [facility], she presented to [facility] ED for further evaluation as she reportedly was improved yet still felt intermittently short of breath with episodes of tachypnea and deep breathing. Associated symptoms included increased dry cough stating. No sputum production or  chest pain.  08/14/2018 and at [facility] ED, she was found to have normal WBC count but significantly hypertensive with mildly elevated and flat trending troponin and elevated BNP. Cardiology consulted for further evaluation and suspicion of CHF.   Vitals: BP 223/122, HR 95, RR 20, T 98.29F, SPO2 96% Labs: Sodium 139, potassium 4.1, glucose 158, creatinine 1.22, calcium 8.7, BNP two 221.0, troponin 0 0.05, WBC 6.4, RBC 4.13, hemoglobin 12.5, hematocrit 39.7, platelets 218 VOH:YWVPX rhythm, bigeminy, 74 bpm left ventricular hypertrophy, IVCD/RBBB CXR: Mild CHF, with moderate to market cardiomegaly and minimal interstitial pulmonary edema.  Small bilateral pleural effusions with associated passive atelectasis in the lower lobes. CT chest without contrast: Moderate to severe aortic atherosclerosis.  Ectatic ascending segment, measuring up to 3.4 cm.  Coronary vascular calcification.  Mild to moderate cardiomegaly. Enlarged pulmonary trunk, suggesting pulmonary hypertension; measuring up to 3.7 cm. Meds: 10 mg IV hydralazine, topical nitro ointment, IV Lasix. Once admitted, the patient was started on metoprolol 12.5 mg twice daily.  On exam today, she reported improved breathing with treatment; however, she continued to have coughing fits during our interview and became SOB with prolonged conversation. She reportedly had not yet ambulated other than to use the restroom.    Past Medical History:  Diagnosis Date  . Anemia   . Arthritis   . Macular degeneration     Past Surgical History:  Procedure Laterality Date  . ABDOMINAL HYSTERECTOMY    . ABDOMINAL SURGERY    . BLADDER SURGERY    .  CARPAL TUNNEL RELEASE    . CESAREAN SECTION     x4  . COLON SURGERY    . FRACTURE SURGERY    . JOINT REPLACEMENT     right hip, bilateral knees  . TRIGGER FINGER RELEASE       Home Medications:  Prior to Admission medications   Medication Sig Start Date End Date Taking? Authorizing Provider  albuterol  (PROVENTIL HFA;VENTOLIN HFA) 108 (90 Base) MCG/ACT inhaler Inhale 1-2 puffs into the lungs every 4 (four) hours as needed for wheezing or shortness of breath.   Yes [provider]  azithromycin (ZITHROMAX) 500 MG tablet Take 500 mg by mouth daily. For 3 days   Yes [provider]  LUMIGAN 0.01 % SOLN Place 1 drop into the left eye at bedtime. 12/19/14  Yes [provider]  meloxicam (MOBIC) 7.5 MG tablet Take 1 tablet (7.5 mg total) by mouth daily. 10/02/17  Yes Crecencio Mc, MD    Inpatient Medications: Scheduled Meds: . enoxaparin (LOVENOX) injection  30 mg Subcutaneous Q24H  . furosemide  20 mg Intravenous BID  . latanoprost  1 drop Left Eye QHS  . mouth rinse  15 mL Mouth Rinse BID  . meloxicam  7.5 mg Oral Daily  . metoprolol tartrate  12.5 mg Oral BID  . nitroGLYCERIN  0.5 inch Topical Q6H   Continuous Infusions:  PRN Meds: acetaminophen **OR** acetaminophen, ondansetron **OR** ondansetron (ZOFRAN) IV  Allergies:    Allergies  Allergen Reactions  . Norvasc [Amlodipine Besylate] Swelling  . Morphine And Related Rash    Social History:   Social History   Socioeconomic History  . Marital status: Widowed    Spouse name: Not on file  . Number of children: Not on file  . Years of education: Not on file  . Highest education level: Not on file  Occupational History  . Not on file  Social Needs  . Financial resource strain: Not on file  . Food insecurity:    Worry: Not on file    Inability: Not on file  . Transportation needs:    Medical: Not on file    Non-medical: Not on file  Tobacco Use  . Smoking status: Former Research scientist (life sciences)  . Smokeless tobacco: Never Used  Substance and Sexual Activity  . Alcohol use: Yes    Comment: glass of wine q 3-4 months  . Drug use: Never  . Sexual activity: Not on file  Lifestyle  . Physical activity:    Days per week: Not on file    Minutes per session: Not on file  . Stress: Not on file  Relationships  .  Social connections:    Talks on phone: Not on file    Gets together: Not on file    Attends religious service: Not on file    Active member of club or organization: Not on file    Attends meetings of clubs or organizations: Not on file    Relationship status: Not on file  . Intimate partner violence:    Fear of current or ex partner: Not on file    Emotionally abused: Not on file    Physically abused: Not on file    Forced sexual activity: Not on file  Other Topics Concern  . Not on file  Social History Narrative  . Not on file    Family History:    Family History  Problem Relation Age of Onset  . Kidney disease Mother   .  CAD Father      ROS:  Please see the history of present illness.  Review of Systems  Constitutional: Negative for chills, fever and malaise/fatigue.  HENT:       Chronic hearing loss, hard of hearing  Respiratory: Positive for cough, shortness of breath and wheezing. Negative for hemoptysis and sputum production.   Cardiovascular: Positive for leg swelling. Negative for chest pain, palpitations, orthopnea and PND.       Reports minor bilateral leg swelling  Gastrointestinal: Negative for abdominal pain and nausea.  Musculoskeletal:       Ambulates with walker Knee and hip replacements  Neurological: Negative for headaches.  All other systems reviewed and are negative.   All other ROS reviewed and negative.     Physical Exam/Data:   Vitals:   08/14/18 2334 08/15/18 0500 08/15/18 0632 08/15/18 0731  BP: (!) 144/82  (!) 162/82 (!) 151/73  Pulse:   77 78  Resp:   18 20  Temp:   97.7 F (36.5 C) 97.9 F (36.6 C)  TempSrc:   Oral Oral  SpO2:   94% 96%  Weight:  66.5 kg    Height:        Intake/Output Summary (Last 24 hours) at 08/15/2018 0800 Last data filed at 08/15/2018 0226 Gross per 24 hour  Intake -  Output 550 ml  Net -550 ml   Filed Weights   08/14/18 1903 08/14/18 2302 08/15/18 0500  Weight: 61.2 kg 66.5 kg 66.5 kg   Body mass  index is 26.81 kg/m.  General: Frail, elderly woman in no acute distress HEENT: normal.  Hard of hearing Neck: no JVD Vascular: No carotid bruits; FA pulses 2+ bilaterally without bruits  Cardiac:  normal S1, S2; RRR; no murmur  Lungs:  clear to auscultation bilaterally with reduced bibasilar breath sounds R>L, no wheezing, rhonchi or rales  Abd: soft, nontender, no hepatomegaly  Ext: 1+ bilateral ankle edema Musculoskeletal:  No deformities, BUE and BLE strength normal and equal Skin: warm and dry  Neuro:  no focal abnormalities noted Psych:  Normal affect   EKG:  The EKG was personally reviewed and demonstrates: As above in HPI Telemetry:  Telemetry was personally reviewed and demonstrates:  SR, PVCs 60-80bpm, ventricular couplets and bigeminy; IVCD v RBBB  Relevant CV Studies: TTE pending   Laboratory Data:  Chemistry Recent Labs  Lab 08/14/18 1908 08/15/18 0445  NA 139 138  K 4.1 3.9  CL 110 108  CO2 22 22  GLUCOSE 158* 119*  BUN 22 22  CREATININE 1.22* 1.17*  CALCIUM 8.7* 8.6*  GFRNONAA 38* 40*  GFRAA 44* 46*  ANIONGAP 7 8    No results for input(s): PROT, ALBUMIN, AST, ALT, ALKPHOS, BILITOT in the last 168 hours. Hematology Recent Labs  Lab 08/14/18 1908 08/15/18 0445  WBC 6.4 7.3  RBC 4.13 3.84*  HGB 12.5 11.8*  HCT 39.7 36.1  MCV 96.1 94.0  MCH 30.3 30.7  MCHC 31.5 32.7  RDW 13.9 14.0  PLT 218 218   Cardiac Enzymes Recent Labs  Lab 08/14/18 1908 08/14/18 2319 08/15/18 0445  TROPONINI 0.05* 0.05* 0.06*   No results for input(s): TROPIPOC in the last 168 hours.  BNP Recent Labs  Lab 08/14/18 1908  BNP 2,221.0*    DDimer No results for input(s): DDIMER in the last 168 hours.  Radiology/Studies:  Dg Chest 2 View  Result Date: 08/14/2018 CLINICAL DATA:  85 year old with acute onset of shortness of breath. EXAM: CHEST -  2 VIEW COMPARISON:  04/10/2014 and earlier, including CT chest 07/15/2008. FINDINGS: AP ERECT and LATERAL images were  obtained. Cardiac silhouette moderately to markedly enlarged for AP technique, significantly increased in size since 2015. Thoracic aorta tortuous and atherosclerotic, unchanged. Hilar and mediastinal contours otherwise unremarkable. Stable RIGHT apicolateral pleural thickening, possibly a subpleural lipoma. Pulmonary venous hypertension and minimal interstitial pulmonary edema as there are Kerley B lines in both lungs. Small BILATERAL pleural effusions with associated mild consolidation in the lower lobes. Severe osseous demineralization. Exaggeration of the usual thoracic kyphosis. Thoracic dextroscoliosis and thoracolumbar levoscoliosis. IMPRESSION: Mild CHF, with moderate to marked cardiomegaly and minimal interstitial pulmonary edema. Small BILATERAL pleural effusions with associated passive atelectasis in the lower lobes. Electronically Signed   By: Evangeline Dakin M.D.   On: 08/14/2018 19:41   Ct Chest Wo Contrast  Result Date: 08/14/2018 CLINICAL DATA:  Dyspnea EXAM: CT CHEST WITHOUT CONTRAST TECHNIQUE: Multidetector CT imaging of the chest was performed following the standard protocol without IV contrast. COMPARISON:  Chest x-ray 08/14/2018, CT chest 07/15/2008 FINDINGS: Cardiovascular: Limited evaluation without intravenous contrast. Moderate severe aortic atherosclerosis. Ectatic ascending segment, measuring up to 3.4 cm. Coronary vascular calcification. Mild to moderate cardiomegaly. Trace pericardial effusion. Enlarged pulmonary trunk, measuring up to 3.7 cm. Mediastinum/Nodes: Midline trachea. Subcentimeter hypodense nodule left lobe of thyroid. Subcentimeter lymph nodes in the mediastinum. The esophagus is within normal limits. Lungs/Pleura: Small bilateral pleural effusions, right greater than left. Septal thickening within the bilateral lung bases, likely interstitial edema. Upper Abdomen: No acute abnormality. Musculoskeletal: Degenerative changes of the spine without acute or suspicious  abnormality IMPRESSION: 1. Cardiomegaly with small bilateral right greater than left pleural effusions. Septal thickening within the bilateral lung bases, suspect interstitial edema. 2. Enlarged pulmonary trunk, suggesting pulmonary hypertension. Aortic Atherosclerosis (ICD10-I70.0). Electronically Signed   By: Donavan Foil M.D.   On: 08/14/2018 21:21    Assessment and Plan:   Acute on chronic congestive heart failure/pulmonary hypertension -Likely etiology of shortness of breath and improved since diuresis started this admission. BNP elevated as above. Continue to monitor I's/O, daily weights.  Daily BMP to monitor renal function and electrolytes. Cr baseline appears to be ~1.0. Continue to monitor. Continue IV Lasix 20mg  BID, metoprolol 12.5mg  BID. Pending TTE to determine EF - would avoid CCB until determining EF. Further recommendations pending TTE. Recommend HF education and diet changes, including low salt diet.  Elevated troponin -No reported chest pain. Etiology of flat trending minimally elevated troponin likely supply demand ischemia in the setting of hypertension and newly diagnosed acute congestive heart failure volume overload pulmonary hypertension -No plans for invasive ischemic evaluation at this time unless significant bump in troponin or complaint of chest pain; however, given CT with CAC and aortic atherosclerosis future outpatient workup might include stress testing.  Hypertension -Per EMR, patient tensing with blood pressure cuff and unclear if initial reading correct so discontinued IV hydralazine. Subsequent BP readings high and would continue metoprolol with titration as HR allows.   Hyperglycemia -A1C 5.1 -Per IM   For questions or updates, please contact [place] Please consult [email or web address] for contact info under     Signed, Arvil Chaco, PA-C  08/15/2018 8:00 AM

## 2018-08-15 NOTE — Progress Notes (Signed)
[place] at [place] NAME: Rebekah Ball    MR#:  [id number]  DATE OF BIRTH:  04-Jun-1921  SUBJECTIVE:  CHIEF COMPLAINT: Patient shortness of breath is better.  Denies any chest pain.  REVIEW OF SYSTEMS:  CONSTITUTIONAL: No fever, fatigue or weakness.  EYES: No blurred or double vision.  EARS, NOSE, AND THROAT: No tinnitus or ear pain.  RESPIRATORY: No cough, reports improving shortness of breath, denies wheezing or hemoptysis.  CARDIOVASCULAR: No chest pain, orthopnea, edema.  GASTROINTESTINAL: No nausea, vomiting, diarrhea or abdominal pain.  GENITOURINARY: No dysuria, hematuria.  ENDOCRINE: No polyuria, nocturia,  HEMATOLOGY: No anemia, easy bruising or bleeding SKIN: No rash or lesion. MUSCULOSKELETAL: No joint pain or arthritis.   NEUROLOGIC: No tingling, numbness, weakness.  PSYCHIATRY: No anxiety or depression.   DRUG ALLERGIES:   Allergies  Allergen Reactions  . Norvasc [Amlodipine Besylate] Swelling  . Morphine And Related Rash    VITALS:  Blood pressure (!) 151/73, pulse 78, temperature 97.9 F (36.6 C), temperature source Oral, resp. rate 20, height 5\' 2"  (1.575 m), weight 66.5 kg, SpO2 96 %.  PHYSICAL EXAMINATION:  GENERAL:  85 y.o.-year-old patient lying in the bed with no acute distress.  EYES: Pupils equal, round, reactive to light and accommodation. No scleral icterus. Extraocular muscles intact.  HEENT: Head atraumatic, normocephalic. Oropharynx and nasopharynx clear.  NECK:  Supple, no jugular venous distention. No thyroid enlargement, no tenderness.  LUNGS: Normal breath sounds bilaterally, no wheezing, rales,rhonchi or crepitation. No use of accessory muscles of respiration.  CARDIOVASCULAR: S1, S2 normal. No murmurs, rubs, or gallops.  ABDOMEN: Soft, nontender, nondistended. Bowel sounds present.  EXTREMITIES: No pedal edema, cyanosis, or clubbing.  NEUROLOGIC: Cranial nerves II through XII are intact.  Muscle strength 5/5 in all extremities. Sensation intact. Gait not checked.  PSYCHIATRIC: The patient is alert and oriented x 3.  SKIN: No obvious rash, lesion, or ulcer.    LABORATORY PANEL:   CBC Recent Labs  Lab 08/15/18 0445  WBC 7.3  HGB 11.8*  HCT 36.1  PLT 218   ------------------------------------------------------------------------------------------------------------------  Chemistries  Recent Labs  Lab 08/15/18 0445  NA 138  K 3.9  CL 108  CO2 22  GLUCOSE 119*  BUN 22  CREATININE 1.17*  CALCIUM 8.6*   ------------------------------------------------------------------------------------------------------------------  Cardiac Enzymes Recent Labs  Lab 08/15/18 0445  TROPONINI 0.06*   ------------------------------------------------------------------------------------------------------------------  RADIOLOGY:  Dg Chest 2 View  Result Date: 08/14/2018 CLINICAL DATA:  85 year old with acute onset of shortness of breath. EXAM: CHEST - 2 VIEW COMPARISON:  04/10/2014 and earlier, including CT chest 07/15/2008. FINDINGS: AP ERECT and LATERAL images were obtained. Cardiac silhouette moderately to markedly enlarged for AP technique, significantly increased in size since 2015. Thoracic aorta tortuous and atherosclerotic, unchanged. Hilar and mediastinal contours otherwise unremarkable. Stable RIGHT apicolateral pleural thickening, possibly a subpleural lipoma. Pulmonary venous hypertension and minimal interstitial pulmonary edema as there are Kerley B lines in both lungs. Small BILATERAL pleural effusions with associated mild consolidation in the lower lobes. Severe osseous demineralization. Exaggeration of the usual thoracic kyphosis. Thoracic dextroscoliosis and thoracolumbar levoscoliosis. IMPRESSION: Mild CHF, with moderate to marked cardiomegaly and minimal interstitial pulmonary edema. Small BILATERAL pleural effusions with associated passive atelectasis in the lower  lobes. Electronically Signed   By: Evangeline Dakin M.D.   On: 08/14/2018 19:41   Ct Chest Wo Contrast  Result Date: 08/14/2018 CLINICAL DATA:  Dyspnea EXAM: CT CHEST WITHOUT CONTRAST TECHNIQUE: Multidetector CT  imaging of the chest was performed following the standard protocol without IV contrast. COMPARISON:  Chest x-ray 08/14/2018, CT chest 07/15/2008 FINDINGS: Cardiovascular: Limited evaluation without intravenous contrast. Moderate severe aortic atherosclerosis. Ectatic ascending segment, measuring up to 3.4 cm. Coronary vascular calcification. Mild to moderate cardiomegaly. Trace pericardial effusion. Enlarged pulmonary trunk, measuring up to 3.7 cm. Mediastinum/Nodes: Midline trachea. Subcentimeter hypodense nodule left lobe of thyroid. Subcentimeter lymph nodes in the mediastinum. The esophagus is within normal limits. Lungs/Pleura: Small bilateral pleural effusions, right greater than left. Septal thickening within the bilateral lung bases, likely interstitial edema. Upper Abdomen: No acute abnormality. Musculoskeletal: Degenerative changes of the spine without acute or suspicious abnormality IMPRESSION: 1. Cardiomegaly with small bilateral right greater than left pleural effusions. Septal thickening within the bilateral lung bases, suspect interstitial edema. 2. Enlarged pulmonary trunk, suggesting pulmonary hypertension. Aortic Atherosclerosis (ICD10-I70.0). Electronically Signed   By: Donavan Foil M.D.   On: 08/14/2018 21:21    EKG:   Orders placed or performed during the hospital encounter of 08/14/18  . EKG 12-Lead  . EKG 12-Lead  . ED EKG within 10 minutes  . ED EKG within 10 minutes    ASSESSMENT AND PLAN:   1.  Acute respiratory distress secondary to Acute diastolic and systolic congestive heart failure.   Clinically improving continue IV Lasix  Monitor electrolytes  Patient started on metoprolol 12.5 mg twice a day  Seen by cardiology [facility]  Echo done results are  pending Outpatient CHF clinic follow-up . 2.  Accelerated hypertension. Blood pressure is better today Continue Cozaar and metoprolol and titrate as needed Discontinue Nitropaste  3.  Impaired fasting glucose  5.1 is the hemoglobin A1c   4.  Borderline elevated troponin likely demand ischemia from acute congestive heart failure and accelerated hypertension  #Aortic atherosclerosis Goal LDL less than 70 Check fasting lipid panel   # macular degeneration and glaucoma on eyedrops at night  #.  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 Management plans discussed with the patient, family and they are in agreement.  CODE STATUS: fc   TOTAL TIME TAKING CARE OF THIS PATIENT: 36 minutes.   POSSIBLE D/C IN 1-2 DAYS, DEPENDING ON CLINICAL CONDITION.  Note: This dictation was prepared with Dragon dictation along with smaller phrase technology. Any transcriptional errors that result from this process are unintentional.   Nicholes Mango M.D on 08/15/2018 at 4:00 PM  Between 7am to 6pm - Pager - [phone] After 6pm go to [email or web address] - password [email or web address] [facility]  Office  [phone]  CC: Primary care physician; Crecencio Mc, MD

## 2018-08-15 NOTE — Plan of Care (Signed)
Breathing has improved

## 2018-08-15 NOTE — Plan of Care (Signed)
  Problem: Education: Goal: Knowledge of General Education information will improve Description Including pain rating scale, medication(s)/side effects and non-pharmacologic comfort measures Outcome: Progressing   Problem: Clinical Measurements: Goal: Will remain free from infection Outcome: Progressing Goal: Diagnostic test results will improve Outcome: Progressing Goal: Respiratory complications will improve Outcome: Progressing   Problem: Activity: Goal: Risk for activity intolerance will decrease Outcome: Progressing   Problem: Coping: Goal: Level of anxiety will decrease Outcome: Progressing   Problem: Elimination: Goal: Will not experience complications related to urinary retention Outcome: Progressing   Problem: Safety: Goal: Ability to remain free from injury will improve Outcome: Progressing   Problem: Skin Integrity: Goal: Risk for impaired skin integrity will decrease Outcome: Progressing

## 2018-08-16 DIAGNOSIS — I2510 Atherosclerotic heart disease of native coronary artery without angina pectoris: Secondary | ICD-10-CM

## 2018-08-16 DIAGNOSIS — I700 Atherosclerosis of aorta: Secondary | ICD-10-CM

## 2018-08-16 DIAGNOSIS — I248 Other forms of acute ischemic heart disease: Secondary | ICD-10-CM

## 2018-08-16 DIAGNOSIS — I130 Hypertensive heart and chronic kidney disease with heart failure and stage 1 through stage 4 chronic kidney disease, or unspecified chronic kidney disease: Secondary | ICD-10-CM

## 2018-08-16 LAB — BASIC METABOLIC PANEL
Anion gap: 8 (ref 5–15)
BUN: 23 mg/dL (ref 8–23)
CO2: 24 mmol/L (ref 22–32)
Calcium: 8.5 mg/dL — ABNORMAL LOW (ref 8.9–10.3)
Chloride: 109 mmol/L (ref 98–111)
Creatinine, Ser: 1.21 mg/dL — ABNORMAL HIGH (ref 0.44–1.00)
GFR calc Af Amer: 44 mL/min — ABNORMAL LOW (ref >60)
GFR, EST NON AFRICAN AMERICAN: 38 mL/min — AB (ref >60)
Glucose, Bld: 99 mg/dL (ref 70–99)
Potassium: 3.7 mmol/L (ref 3.5–5.1)
Sodium: 141 mmol/L (ref 135–145)

## 2018-08-16 LAB — LIPID PANEL
Cholesterol: 156 mg/dL (ref 0–200)
HDL: 52 mg/dL (ref >40)
LDL CALC: 95 mg/dL (ref 0–99)
Total CHOL/HDL Ratio: 3 RATIO
Triglycerides: 45 mg/dL (ref <150)
VLDL: 9 mg/dL (ref 0–40)

## 2018-08-16 MED ORDER — LOSARTAN POTASSIUM 50 MG PO TABS
50.0000 mg | ORAL_TABLET | Freq: Once | ORAL | Status: AC
  Administered 2018-08-16: 50 mg via ORAL
  Filled 2018-08-16: qty 1

## 2018-08-16 MED ORDER — LOSARTAN POTASSIUM 50 MG PO TABS
100.0000 mg | ORAL_TABLET | Freq: Every day | ORAL | Status: DC
  Administered 2018-08-17: 100 mg via ORAL
  Filled 2018-08-16: qty 2

## 2018-08-16 NOTE — Plan of Care (Signed)
  Problem: Activity: Goal: Risk for activity intolerance will decrease Outcome: Progressing Note:  Up with standby assist to bathroom tolerating well   Problem: Elimination: Goal: Will not experience complications related to urinary retention Outcome: Progressing   Problem: Pain Managment: Goal: General experience of comfort will improve Outcome: Progressing Note:  No complaints of pain this shift   Problem: Safety: Goal: Ability to remain free from injury will improve Outcome: Progressing   Problem: Skin Integrity: Goal: Risk for impaired skin integrity will decrease Outcome: Progressing   Problem: Education: Goal: Knowledge of General Education information will improve Description Including pain rating scale, medication(s)/side effects and non-pharmacologic comfort measures Outcome: Completed/Met   Problem: Nutrition: Goal: Adequate nutrition will be maintained Outcome: Completed/Met   Problem: Coping: Goal: Level of anxiety will decrease Outcome: Completed/Met

## 2018-08-16 NOTE — Care Management Important Message (Signed)
Copy of signed Medicare IM left with patient in room. 

## 2018-08-16 NOTE — Progress Notes (Signed)
Cardiovascular and Pulmonary Nurse Navigator Note:    85 year old female who presented to the ED with SOB.  BNP 2221.  CSR consistent with mild CHF, minimal interstitial pulmonary edema, small pleural effusions.   Chest CT: IMPRESSION: 1. Cardiomegaly with small bilateral right greater than left pleural effusions. Septal thickening within the bilateral lung bases, suspect interstitial edema. 2. Enlarged pulmonary trunk, suggesting pulmonary hypertension.  Patient is a former smoker.    EDUCATION:   Attempted to round on patient, but room was filled with visitors and employees of private agency, [employer].  As the employees of [employer] were leaving the room this RN asked them if they were going to be seeing this patient.  They will be sitting with the patient 12 hours per day.  This RN informed them that she needed to be on low sodium diet and weigh herself every day.  Offered to give booklet, "Living Better with Heart Failure," but this was declined.  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  It is anticipated that patient will be discharged back to independent living at [place] tomorrow.  Patient resides in the cottages (Independent Living).    Patient's assigned RN, Illene Bolus to review "Living Better with Heart Failure" packet with patient's son and daughter-in-law before they return to [place] this evening.    Patient has a new patient appt in the [facility] on 08/21/2018 at 2:40 p.m.    Roanna Epley, RN, BSN, [facility]  Cardiovascular & Pulmonary Nurse Navigator  Direct Line: [phone]  Department Phone #: [phone] Fax: [phone]  Email Address: [email or web address]

## 2018-08-16 NOTE — Clinical Social Work Note (Signed)
Patient is from [facility].  Patient is planning to return, case manager is aware, patient is planning to go home with home health.  CSW signing off please reconsult if social work needs arise.  Jones Broom. [place], MSW, [place]  08/16/2018 5:00 PM

## 2018-08-16 NOTE — Progress Notes (Signed)
Per orders from Dr. Margaretmary Eddy discontinue nitroglycerin ointment. Will continue to monitor patient.

## 2018-08-16 NOTE — Plan of Care (Signed)
  Problem: Education: Goal: Ability to demonstrate management of disease process will improve Outcome: Progressing Goal: Individualized Educational Video(s) Outcome: Progressing

## 2018-08-16 NOTE — Progress Notes (Signed)
Patient's family returning to [place] today, they left specific discharge instructions incase patient discharges tomorrow. Instructions left on treatment team sticky notes.

## 2018-08-16 NOTE — Progress Notes (Signed)
[place] at [place] NAME: Rebekah Ball    MR#:  [id number]  DATE OF BIRTH:  [birth year]  SUBJECTIVE:  CHIEF COMPLAINT: Patient shortness of breath is better.  Denies any exertional dyspnea or chest pain.  Seen by Dr. Rockey Situ  REVIEW OF SYSTEMS:  CONSTITUTIONAL: No fever, fatigue or weakness.  EYES: No blurred or double vision.  EARS, NOSE, AND THROAT: No tinnitus or ear pain.  RESPIRATORY: No cough, reports improving shortness of breath, denies wheezing or hemoptysis.  CARDIOVASCULAR: No chest pain, orthopnea, edema.  GASTROINTESTINAL: No nausea, vomiting, diarrhea or abdominal pain.  GENITOURINARY: No dysuria, hematuria.  ENDOCRINE: No polyuria, nocturia,  HEMATOLOGY: No anemia, easy bruising or bleeding SKIN: No rash or lesion. MUSCULOSKELETAL: No joint pain or arthritis.   NEUROLOGIC: No tingling, numbness, weakness.  PSYCHIATRY: No anxiety or depression.   DRUG ALLERGIES:   Allergies  Allergen Reactions  . Norvasc [Amlodipine Besylate] Swelling  . Morphine And Related Rash    VITALS:  Blood pressure (!) 161/98, pulse 73, temperature 98.3 F (36.8 C), temperature source Oral, resp. rate 20, height 5\' 2"  (1.575 m), weight 66.5 kg, SpO2 92 %.  PHYSICAL EXAMINATION:  GENERAL:  85 y.o.-year-old patient lying in the bed with no acute distress.  EYES: Pupils equal, round, reactive to light and accommodation. No scleral icterus. Extraocular muscles intact.  HEENT: Head atraumatic, normocephalic. Oropharynx and nasopharynx clear.  NECK:  Supple, no jugular venous distention. No thyroid enlargement, no tenderness.  LUNGS: Normal breath sounds bilaterally, no wheezing, rales,rhonchi or crepitation. No use of accessory muscles of respiration.  CARDIOVASCULAR: S1, S2 normal. No murmurs, rubs, or gallops.  ABDOMEN: Soft, nontender, nondistended. Bowel sounds present.  EXTREMITIES: No pedal edema, cyanosis, or clubbing.  NEUROLOGIC:  Cranial nerves II through XII are intact. Muscle strength 5/5 in all extremities. Sensation intact. Gait not checked.  PSYCHIATRIC: The patient is alert and oriented x 3.  SKIN: No obvious rash, lesion, or ulcer.    LABORATORY PANEL:   CBC Recent Labs  Lab 08/15/18 0445  WBC 7.3  HGB 11.8*  HCT 36.1  PLT 218   ------------------------------------------------------------------------------------------------------------------  Chemistries  Recent Labs  Lab 08/16/18 0756  NA 141  K 3.7  CL 109  CO2 24  GLUCOSE 99  BUN 23  CREATININE 1.21*  CALCIUM 8.5*   ------------------------------------------------------------------------------------------------------------------  Cardiac Enzymes Recent Labs  Lab 08/15/18 0445  TROPONINI 0.06*   ------------------------------------------------------------------------------------------------------------------  RADIOLOGY:  Dg Chest 2 View  Result Date: 08/14/2018 CLINICAL DATA:  85 year old with acute onset of shortness of breath. EXAM: CHEST - 2 VIEW COMPARISON:  04/10/2014 and earlier, including CT chest 07/15/2008. FINDINGS: AP ERECT and LATERAL images were obtained. Cardiac silhouette moderately to markedly enlarged for AP technique, significantly increased in size since 2015. Thoracic aorta tortuous and atherosclerotic, unchanged. Hilar and mediastinal contours otherwise unremarkable. Stable RIGHT apicolateral pleural thickening, possibly a subpleural lipoma. Pulmonary venous hypertension and minimal interstitial pulmonary edema as there are Kerley B lines in both lungs. Small BILATERAL pleural effusions with associated mild consolidation in the lower lobes. Severe osseous demineralization. Exaggeration of the usual thoracic kyphosis. Thoracic dextroscoliosis and thoracolumbar levoscoliosis. IMPRESSION: Mild CHF, with moderate to marked cardiomegaly and minimal interstitial pulmonary edema. Small BILATERAL pleural effusions with  associated passive atelectasis in the lower lobes. Electronically Signed   By: Evangeline Dakin M.D.   On: 08/14/2018 19:41   Ct Chest Wo Contrast  Result Date: 08/14/2018 CLINICAL DATA:  Dyspnea  EXAM: CT CHEST WITHOUT CONTRAST TECHNIQUE: Multidetector CT imaging of the chest was performed following the standard protocol without IV contrast. COMPARISON:  Chest x-ray 08/14/2018, CT chest 07/15/2008 FINDINGS: Cardiovascular: Limited evaluation without intravenous contrast. Moderate severe aortic atherosclerosis. Ectatic ascending segment, measuring up to 3.4 cm. Coronary vascular calcification. Mild to moderate cardiomegaly. Trace pericardial effusion. Enlarged pulmonary trunk, measuring up to 3.7 cm. Mediastinum/Nodes: Midline trachea. Subcentimeter hypodense nodule left lobe of thyroid. Subcentimeter lymph nodes in the mediastinum. The esophagus is within normal limits. Lungs/Pleura: Small bilateral pleural effusions, right greater than left. Septal thickening within the bilateral lung bases, likely interstitial edema. Upper Abdomen: No acute abnormality. Musculoskeletal: Degenerative changes of the spine without acute or suspicious abnormality IMPRESSION: 1. Cardiomegaly with small bilateral right greater than left pleural effusions. Septal thickening within the bilateral lung bases, suspect interstitial edema. 2. Enlarged pulmonary trunk, suggesting pulmonary hypertension. Aortic Atherosclerosis (ICD10-I70.0). Electronically Signed   By: Donavan Foil M.D.   On: 08/14/2018 21:21    EKG:   Orders placed or performed during the hospital encounter of 08/14/18  . EKG 12-Lead  . EKG 12-Lead  . ED EKG within 10 minutes  . ED EKG within 10 minutes    ASSESSMENT AND PLAN:   1.  Acute respiratory distress secondary to Acute diastolic and systolic congestive heart failure.   Clinically improving continue IV Lasix  Monitor electrolytes  Patient started on metoprolol 12.5 mg twice a day  Seen by  cardiology [facility]  Echo done results are pending Outpatient CHF clinic follow-up . 2.  Accelerated hypertension. Blood pressure is better today Continue Cozaar dose increased to 100 mg and metoprolol dose titrated to 50 mg and titrate as needed Discontinue Nitropaste  3.  Impaired fasting glucose  5.1 is the hemoglobin A1c   4.  Borderline elevated troponin likely demand ischemia from acute congestive heart failure and accelerated hypertension  #Aortic atherosclerosis Goal LDL less than 70 Check fasting lipid panel   # macular degeneration and glaucoma on eyedrops at night  #.  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 Management plans discussed with the patient, Dr. Rockey Situ discussed with son and daughter-in-law who live in [place]: 36 minutes.   POSSIBLE D/C IN 1- DAYS, DEPENDING ON CLINICAL CONDITION.  Note: This dictation was prepared with Dragon dictation along with smaller phrase technology. Any transcriptional errors that result from this process are unintentional.   Nicholes Mango M.D on 08/16/2018 at 3:20 PM  Between 7am to 6pm - Pager - [phone] After 6pm go to [email or web address] - password [email or web address] [facility]  Office  [phone]  CC: Primary care physician; Crecencio Mc, MD

## 2018-08-16 NOTE — Progress Notes (Signed)
Patient and family given "Living Better with Heart Failure" packet. Educated family on packet content and CHF videos shown. Family appreciated education.

## 2018-08-16 NOTE — Progress Notes (Signed)
CCMD reports 2.05 sec pause. Pt asymptomatic, no concerns offered. MD Salary made aware. Will continue to monitor.

## 2018-08-16 NOTE — Progress Notes (Signed)
Progress Note  Patient Name: Rebekah Ball Date of Encounter: 08/16/2018  Primary Cardiologist: new to [facility]  Subjective   Cleaning up this morning, bathing, standing in the bathroom, walking back to the bed with shortness of breath requiring some recovery Reports she is not panting as she was when she first came in 1.5 L negative Blood pressure continues to run high averaging 536 systolic Again reports she does not like to take medications  Inpatient Medications    Scheduled Meds: . enoxaparin (LOVENOX) injection  30 mg Subcutaneous Q24H  . furosemide  20 mg Intravenous BID  . latanoprost  1 drop Left Eye QHS  . [START ON 08/17/2018] losartan  100 mg Oral Daily  . mouth rinse  15 mL Mouth Rinse BID  . meloxicam  7.5 mg Oral Daily  . metoprolol succinate  50 mg Oral Daily  . sodium chloride flush  3 mL Intravenous Q12H   Continuous Infusions:  PRN Meds: acetaminophen **OR** acetaminophen, ondansetron **OR** ondansetron (ZOFRAN) IV   Vital Signs    Vitals:   08/15/18 2030 08/16/18 0141 08/16/18 0336 08/16/18 0750  BP: (!) 132/46 (!) 166/66 (!) 177/77 (!) 161/98  Pulse: 66 87 69 73  Resp: 18  20 20   Temp: 98.5 F (36.9 C)  98.3 F (36.8 C) 98.3 F (36.8 C)  TempSrc: Oral  Oral Oral  SpO2: 97% 95% 95% 92%  Weight:      Height:        Intake/Output Summary (Last 24 hours) at 08/16/2018 1257 Last data filed at 08/16/2018 0730 Gross per 24 hour  Intake -  Output 900 ml  Net -900 ml   Filed Weights   08/14/18 1903 08/14/18 2302 08/15/18 0500  Weight: 61.2 kg 66.5 kg 66.5 kg    Telemetry    NSR - Personally Reviewed  ECG     - Personally Reviewed  Physical Exam   Constitutional:  oriented to person, place, and time. No distress.  HENT:  Head: Grossly normal Eyes:  no discharge. No scleral icterus.  Neck: No JVD, no carotid bruits  Cardiovascular: Regular rate and rhythm, no murmurs appreciated Pulmonary/Chest: Clear to auscultation bilaterally,  scattered Rales at the bases Abdominal: Soft.  no distension.  no tenderness.  Musculoskeletal: Normal range of motion Neurological:  normal muscle tone. Coordination normal. No atrophy Skin: Skin warm and dry Psychiatric: normal affect, pleasant   Labs    Chemistry Recent Labs  Lab 08/14/18 1908 08/15/18 0445 08/16/18 0756  NA 139 138 141  K 4.1 3.9 3.7  CL 110 108 109  CO2 22 22 24   GLUCOSE 158* 119* 99  BUN 22 22 23   CREATININE 1.22* 1.17* 1.21*  CALCIUM 8.7* 8.6* 8.5*  GFRNONAA 38* 40* 38*  GFRAA 44* 46* 44*  ANIONGAP 7 8 8      Hematology Recent Labs  Lab 08/14/18 1908 08/15/18 0445  WBC 6.4 7.3  RBC 4.13 3.84*  HGB 12.5 11.8*  HCT 39.7 36.1  MCV 96.1 94.0  MCH 30.3 30.7  MCHC 31.5 32.7  RDW 13.9 14.0  PLT 218 218    Cardiac Enzymes Recent Labs  Lab 08/14/18 1908 08/14/18 2319 08/15/18 0445  TROPONINI 0.05* 0.05* 0.06*   No results for input(s): TROPIPOC in the last 168 hours.   BNP Recent Labs  Lab 08/14/18 1908  BNP 2,221.0*     DDimer No results for input(s): DDIMER in the last 168 hours.   Radiology    Dg  Chest 2 View  Result Date: 08/14/2018 CLINICAL DATA:  85 year old with acute onset of shortness of breath. EXAM: CHEST - 2 VIEW COMPARISON:  04/10/2014 and earlier, including CT chest 07/15/2008. FINDINGS: AP ERECT and LATERAL images were obtained. Cardiac silhouette moderately to markedly enlarged for AP technique, significantly increased in size since 2015. Thoracic aorta tortuous and atherosclerotic, unchanged. Hilar and mediastinal contours otherwise unremarkable. Stable RIGHT apicolateral pleural thickening, possibly a subpleural lipoma. Pulmonary venous hypertension and minimal interstitial pulmonary edema as there are Kerley B lines in both lungs. Small BILATERAL pleural effusions with associated mild consolidation in the lower lobes. Severe osseous demineralization. Exaggeration of the usual thoracic kyphosis. Thoracic dextroscoliosis  and thoracolumbar levoscoliosis. IMPRESSION: Mild CHF, with moderate to marked cardiomegaly and minimal interstitial pulmonary edema. Small BILATERAL pleural effusions with associated passive atelectasis in the lower lobes. Electronically Signed   By: Evangeline Dakin M.D.   On: 08/14/2018 19:41   Ct Chest Wo Contrast  Result Date: 08/14/2018 CLINICAL DATA:  Dyspnea EXAM: CT CHEST WITHOUT CONTRAST TECHNIQUE: Multidetector CT imaging of the chest was performed following the standard protocol without IV contrast. COMPARISON:  Chest x-ray 08/14/2018, CT chest 07/15/2008 FINDINGS: Cardiovascular: Limited evaluation without intravenous contrast. Moderate severe aortic atherosclerosis. Ectatic ascending segment, measuring up to 3.4 cm. Coronary vascular calcification. Mild to moderate cardiomegaly. Trace pericardial effusion. Enlarged pulmonary trunk, measuring up to 3.7 cm. Mediastinum/Nodes: Midline trachea. Subcentimeter hypodense nodule left lobe of thyroid. Subcentimeter lymph nodes in the mediastinum. The esophagus is within normal limits. Lungs/Pleura: Small bilateral pleural effusions, right greater than left. Septal thickening within the bilateral lung bases, likely interstitial edema. Upper Abdomen: No acute abnormality. Musculoskeletal: Degenerative changes of the spine without acute or suspicious abnormality IMPRESSION: 1. Cardiomegaly with small bilateral right greater than left pleural effusions. Septal thickening within the bilateral lung bases, suspect interstitial edema. 2. Enlarged pulmonary trunk, suggesting pulmonary hypertension. Aortic Atherosclerosis (ICD10-I70.0). Electronically Signed   By: Donavan Foil M.D.   On: 08/14/2018 21:21    Cardiac Studies   Echocardiogram yesterday Left ventricle: The cavity size was normal. There was mild   concentric hypertrophy. Systolic function was mildly reduced. The   estimated ejection fraction was in the range of 45% to 50%.   Diffuse hypokinesis.  Regional wall motion abnormalities cannot be   excluded. Features are consistent with a pseudonormal left   ventricular filling pattern, with concomitant abnormal relaxation   and increased filling pressure (grade 2 diastolic dysfunction). - Aortic valve: There was mild stenosis. There was mild   regurgitation. - Mitral valve: Calcified annulus. Mildly thickened leaflets .   There was mild regurgitation. - Left atrium: The atrium was moderately dilated. - Right ventricle: The cavity size was mildly dilated. Wall   thickness was normal. Systolic function was normal. - Pulmonary arteries: Systolic pressure was mild to moderately   elevated. PA peak pressure: 49 mm Hg (S).   Patient Profile     85 year old woman with no prior cardiac history who lives in [place] at nursing facility independent living presenting with worsening shortness of breath over the past several days.,  Markedly elevated blood pressure on arrival 481 systolic in the emergency room  -Echocardiogram showing mildly depressed ejection fraction 45%, moderately elevated right heart pressures  --CT scan with moderate to severe aortic atherosclerosis, three-vessel coronary calcification at least moderate  Assessment & Plan    A/P: 1) Acute respiratory distress Secondary to acute diastolic and systolic CHF, in the setting of hypertensive heart disease --  continue diuresis, Lasix 20 IV twice daily  Again we have done further CHF education this morning Suspect we will need to get family involved who live in [place] Patient is fiercely independent and does not like taking medications Eats out frequently, high fluid intake, coffee tea and milk -Would benefit from outpatient ischemic work-up -She will need Lasix 20 mg daily at discharge  2) essential hypertension Poorly controlled Losartan up to 100 mg today Continue metoprolol succinate Continue diuresis If blood pressure continues to run high will need further  adjustment  3) diabetes type 2 Elevated glucose without elevated hemoglobin A1c Dietary restriction recommended  4) elevated troponin At least moderate coronary calcification seen on CT scan Minimal elevation of troponin, demand ischemia this admission Recommend outpatient stress testing  5) aortic atherosclerosis Moderate to severe on CT scan Goal LDL less than 70 LDL not at goal we will start low-dose statin  Long discussion with patient's son and daughter-in-law who live in [place] All of the results were discussed with him including echocardiogram, CT scan, lab work, presentation Discussed mechanism of hypertensive heart disease, mildly depressed ejection fraction, need to control her blood pressure, modify her diet for low-salt and minimize fluid intake Need for regular outpatient cardiology follow-up, need to take medications  Total encounter time more than 45 minutes  Greater than 50% was spent in counseling and coordination of care with the patient   For questions or updates, please contact [facility] Please consult [email or web address] for contact info under        Signed, Ida Rogue, MD  08/16/2018, 12:57 PM

## 2018-08-17 MED ORDER — METOPROLOL SUCCINATE ER 50 MG PO TB24: 50.0000 mg | ORAL_TABLET | Freq: Every day | ORAL | 0 refills | Status: DC

## 2018-08-17 MED ORDER — LOSARTAN POTASSIUM 100 MG PO TABS: 100.0000 mg | ORAL_TABLET | Freq: Every day | ORAL | 0 refills | Status: DC

## 2018-08-17 MED ORDER — FUROSEMIDE 20 MG PO TABS: 20.0000 mg | ORAL_TABLET | Freq: Every day | ORAL | 11 refills | Status: DC

## 2018-08-17 NOTE — Progress Notes (Signed)
Gloriana J Anwar to be D/C'd Home per MD order.  Discussed prescriptions and follow up appointments with the patient. Prescriptions given to patient, Prescriptions faxed to [facility] medication list explained in detail. Pt verbalized understanding. [facility] transportation called as well as Probation officer at [employer].   Allergies as of 08/17/2018      Reactions   Norvasc [amlodipine Besylate] Swelling   Morphine And Related Rash      Medication List    STOP taking these medications   azithromycin 500 MG tablet Commonly known as:  ZITHROMAX     TAKE these medications   albuterol 108 (90 Base) MCG/ACT inhaler Commonly known as:  PROVENTIL HFA;VENTOLIN HFA Inhale 1-2 puffs into the lungs every 4 (four) hours as needed for wheezing or shortness of breath.   furosemide 20 MG tablet Commonly known as:  LASIX Take 1 tablet (20 mg total) by mouth daily.   losartan 100 MG tablet Commonly known as:  COZAAR Take 1 tablet (100 mg total) by mouth daily. Start taking on:  August 18, 2018   LUMIGAN 0.01 % Soln Generic drug:  bimatoprost Place 1 drop into the left eye at bedtime.   meloxicam 7.5 MG tablet Commonly known as:  MOBIC Take 1 tablet (7.5 mg total) by mouth daily.   metoprolol succinate 50 MG 24 hr tablet Commonly known as:  TOPROL-XL Take 1 tablet (50 mg total) by mouth daily. Take with or immediately following a meal. Start taking on:  August 18, 2018       Vitals:   08/17/18 0423 08/17/18 0815  BP: (!) 165/100 (!) 169/95  Pulse: 68 70  Resp: 20   Temp: 98 F (36.7 C) 98.5 F (36.9 C)  SpO2: 95% 96%    Skin clean, dry and intact without evidence of skin break down, no evidence of skin tears noted. IV catheter discontinued intact. Site without signs and symptoms of complications. Dressing and pressure applied. Pt denies pain at this time. No complaints noted.  An After Visit Summary was printed and given to the patient. Patient waiting for [employer]  to pick up [employer]

## 2018-08-17 NOTE — Progress Notes (Signed)
Patient taken off the floor wheelchair, transported by [facility] staff. Packet was given to patient. No distress noted on ra

## 2018-08-17 NOTE — Progress Notes (Signed)
Progress Note  Patient Name: Rebekah Ball Date of Encounter: 08/17/2018  Primary Cardiologist: new to [facility]  Subjective  I don't remember being sob. " When are they going to call my daughter to take me home. "  Inpatient Medications    Scheduled Meds: . enoxaparin (LOVENOX) injection  30 mg Subcutaneous Q24H  . furosemide  20 mg Intravenous BID  . latanoprost  1 drop Left Eye QHS  . losartan  100 mg Oral Daily  . mouth rinse  15 mL Mouth Rinse BID  . meloxicam  7.5 mg Oral Daily  . metoprolol succinate  50 mg Oral Daily  . sodium chloride flush  3 mL Intravenous Q12H   Continuous Infusions:  PRN Meds: acetaminophen **OR** acetaminophen, ondansetron **OR** ondansetron (ZOFRAN) IV   Vital Signs    Vitals:   08/16/18 1630 08/16/18 2014 08/17/18 0423 08/17/18 0815  BP: (!) 158/63 (!) 163/62 (!) 165/100 (!) 169/95  Pulse: 69 64 68 70  Resp:  20 20   Temp:  98.2 F (36.8 C) 98 F (36.7 C) 98.5 F (36.9 C)  TempSrc:  Oral Oral Oral  SpO2:  95% 95% 96%  Weight:   64 kg   Height:        Intake/Output Summary (Last 24 hours) at 08/17/2018 1126 Last data filed at 08/17/2018 1054 Gross per 24 hour  Intake 360 ml  Output 1850 ml  Net -1490 ml   Net negative 3 L   Filed Weights   08/14/18 2302 08/15/18 0500 08/17/18 0423  Weight: 66.5 kg 66.5 kg 64 kg    Telemetry    - Personally Reviewed  ECG       Physical Exam   Constitutional:  oriented to person, place, and time. No distress.  HENT:  Head: Grossly normal Eyes:  no discharge. No scleral icterus.  Neck: No JVD, no carotid bruits  Cardiovascular: Regular rate and rhythm, no murmurs appreciated Pulmonary/Chest: Clear to auscultation bilaterally, scattered Rales at the bases Abdominal: Soft.  no distension.  no tenderness.  Musculoskeletal: Normal range of motion, trace edema. Neurological:  normal muscle tone. Coordination normal. No atrophy Skin: Skin warm and dry Psychiatric: normal affect,  pleasant   Labs    Chemistry Recent Labs  Lab 08/14/18 1908 08/15/18 0445 08/16/18 0756  NA 139 138 141  K 4.1 3.9 3.7  CL 110 108 109  CO2 22 22 24   GLUCOSE 158* 119* 99  BUN 22 22 23   CREATININE 1.22* 1.17* 1.21*  CALCIUM 8.7* 8.6* 8.5*  GFRNONAA 38* 40* 38*  GFRAA 44* 46* 44*  ANIONGAP 7 8 8      Hematology Recent Labs  Lab 08/14/18 1908 08/15/18 0445  WBC 6.4 7.3  RBC 4.13 3.84*  HGB 12.5 11.8*  HCT 39.7 36.1  MCV 96.1 94.0  MCH 30.3 30.7  MCHC 31.5 32.7  RDW 13.9 14.0  PLT 218 218    Cardiac Enzymes Recent Labs  Lab 08/14/18 1908 08/14/18 2319 08/15/18 0445  TROPONINI 0.05* 0.05* 0.06*   No results for input(s): TROPIPOC in the last 168 hours.   BNP Recent Labs  Lab 08/14/18 1908  BNP 2,221.0*     DDimer No results for input(s): DDIMER in the last 168 hours.   Radiology    No results found.  Cardiac Studies   Echocardiogram yesterday Left ventricle: The cavity size was normal. There was mild   concentric hypertrophy. Systolic function was mildly reduced. The   estimated ejection  fraction was in the range of 45% to 50%.   Diffuse hypokinesis. Regional wall motion abnormalities cannot be   excluded. Features are consistent with a pseudonormal left   ventricular filling pattern, with concomitant abnormal relaxation   and increased filling pressure (grade 2 diastolic dysfunction). - Aortic valve: There was mild stenosis. There was mild   regurgitation. - Mitral valve: Calcified annulus. Mildly thickened leaflets .   There was mild regurgitation. - Left atrium: The atrium was moderately dilated. - Right ventricle: The cavity size was mildly dilated. Wall   thickness was normal. Systolic function was normal. - Pulmonary arteries: Systolic pressure was mild to moderately   elevated. PA peak pressure: 49 mm Hg (S).   Patient Profile     85 year old woman with no prior cardiac history who [facility]  independent living presenting with worsening shortness of breath over the past several days.,  Markedly elevated blood pressure on arrival 222 systolic in the emergency room  -Echocardiogram showing mildly depressed ejection fraction 45%, moderately elevated right heart pressures  --CT scan with moderate to severe aortic atherosclerosis, three-vessel coronary calcification at least moderate  Pt does not want to like to take meds    Assessment & Plan    A/P: 1) Acute respiratory distress Secondary to acute diastolic and systolic CHF, in the setting of hypertensive heart disease -- continue diuresis, Lasix 20 IV twice daily  Again we have done further CHF education this morning Suspect we will need to get family involved who live in [place] Patient is fiercely independent and does not like taking medications Eats out frequently, high fluid intake, coffee tea and milk -Would benefit from outpatient ischemic work-up -She will need Lasix 20 mg daily at discharge  2) essential hypertension Poorly controlled Losartan up to 100 mg today Continue metoprolol succinate Continue diuresis If blood pressure continues to run high will need further adjustment  3) diabetes type 2 Elevated glucose without elevated hemoglobin A1c Dietary restriction recommended  4) elevated troponin At least moderate coronary calcification seen on CT scan Minimal elevation of troponin, demand ischemia this admission Recommend outpatient stress testing  5) aortic atherosclerosis Moderate to severe on CT scan Goal LDL less than 70 LDL not at goal we will start low-dose statin  Disp. - at this point she is probable stabile for DC from a cardiac perspective. Ideally she would be on lasix 20 mg daily. Not sure she will take however.  For questions or updates, please contact [place] Please consult [email or web address] for contact info under        Signed, Cristopher Peru, MD  08/17/2018, 11:26 AM

## 2018-08-17 NOTE — Care Management Note (Signed)
Case Management Note  Patient Details  Name: MADELLINE ESHBACH MRN: [id number] Date of Birth: 01/17/1921  Subjective/Objective:   Patient to be discharged per MD order. Orders in place for home health services. [facility] helps with tasks around the house. Patient agreeable to home health and we discussed the process since she wants to go to the [facility] [place] rehab just for PT. We discussed the independent living area she is in and she is agreeable to home health. CMS Medicare.gov Compare Post Acute Care list reviewed with patient has no preference. Referral placed with Jermaine at [place] care. Transport to be done by [facility].                   Action/Plan:   Expected Discharge Date:  08/17/18               Expected Discharge Plan:  [place]  In-House Referral:     Discharge planning Services  CM Consult  Post Acute Care Choice:  Home Health Choice offered to:  Patient  DME Arranged:    DME Agency:     HH Arranged:  RN, PT, Nurse's Aide [place] Agency:  [place]  Status of Service:  Completed, signed off  If discussed at [place] of Stay Meetings, dates discussed:    Additional Comments:  Latanya Maudlin, RN 08/17/2018, 12:28 PM

## 2018-08-17 NOTE — Discharge Summary (Signed)
[place] [facility] [place] NAME: Rebekah Ball    MR#:  [id number]  DATE OF BIRTH:  20-Feb-1921  DATE OF ADMISSION:  08/14/2018 ADMITTING PHYSICIAN: Loletha Grayer, MD  DATE OF DISCHARGE: 08/17/2018  PRIMARY CARE PHYSICIAN: Crecencio Mc, MD    ADMISSION DIAGNOSIS:  SOB   DISCHARGE DIAGNOSIS:  Principal Problem:   Hypertensive heart and chronic kidney disease with heart failure and stage 1 through stage 4 chronic kidney disease, or chronic kidney disease (HCC) Active Problems:   CHF (congestive heart failure) (HCC)   Aortic atherosclerosis (HCC)   Coronary artery calcification seen on CAT scan   Demand ischemia ([place])   SECONDARY DIAGNOSIS:   Past Medical History:  Diagnosis Date  . Anemia   . Arthritis   . Macular degeneration     HOSPITAL COURSE:  85 year old female who presented to the emergency room with elevated blood pressure and shortness of breath.  1.  Acute combined diastolic and systolic heart failure with ejection fraction of 45%: Patient evaluated by cardiology while in the hospital.  She was diuresed with IV Lasix.  Recommendations are to be referred to CHF clinic as well as Lasix 20 mg daily. Acute CHF exacerbation in the setting of hypertensive heart disease.   2.  Essential hypertension: Patient will be discharged on oral losartan and metoprolol.  She will need follow-up.  3.  Elevated troponin due to demand ischemia: Patient was ruled out for ACS  4.  Macular degeneration: Continue eyedrops  DISCHARGE CONDITIONS AND DIET:    Stable for discharge on heart healthy diet  CONSULTS OBTAINED:  Treatment Team:  Minna Merritts, MD  DRUG ALLERGIES:   Allergies  Allergen Reactions  . Norvasc [Amlodipine Besylate] Swelling  . Morphine And Related Rash    DISCHARGE MEDICATIONS:   Allergies as of 08/17/2018      Reactions   Norvasc [amlodipine Besylate] Swelling   Morphine And Related Rash      Medication  List    STOP taking these medications   azithromycin 500 MG tablet Commonly known as:  ZITHROMAX     TAKE these medications   albuterol 108 (90 Base) MCG/ACT inhaler Commonly known as:  PROVENTIL HFA;VENTOLIN HFA Inhale 1-2 puffs into the lungs every 4 (four) hours as needed for wheezing or shortness of breath.   furosemide 20 MG tablet Commonly known as:  LASIX Take 1 tablet (20 mg total) by mouth daily.   losartan 100 MG tablet Commonly known as:  COZAAR Take 1 tablet (100 mg total) by mouth daily. Start taking on:  August 18, 2018   LUMIGAN 0.01 % Soln Generic drug:  bimatoprost Place 1 drop into the left eye at bedtime.   meloxicam 7.5 MG tablet Commonly known as:  MOBIC Take 1 tablet (7.5 mg total) by mouth daily.   metoprolol succinate 50 MG 24 hr tablet Commonly known as:  TOPROL-XL Take 1 tablet (50 mg total) by mouth daily. Take with or immediately following a meal. Start taking on:  August 18, 2018         Today   CHIEF COMPLAINT:  Patient wants to go home today no acute events overnight.   VITAL SIGNS:  Blood pressure (!) 169/95, pulse 70, temperature 98.5 F (36.9 C), temperature source Oral, resp. rate 20, height 5\' 2"  (1.575 m), weight 64 kg, SpO2 96 %.   REVIEW OF SYSTEMS:  Review of Systems  Constitutional: Negative.  Negative for chills,  fever and malaise/fatigue.  HENT: Negative.  Negative for ear discharge, ear pain, hearing loss, nosebleeds and sore throat.   Eyes: Negative.  Negative for blurred vision and pain.  Respiratory: Negative.  Negative for cough, hemoptysis, shortness of breath and wheezing.   Cardiovascular: Negative.  Negative for chest pain, palpitations and leg swelling.  Gastrointestinal: Negative.  Negative for abdominal pain, blood in stool, diarrhea, nausea and vomiting.  Genitourinary: Negative.  Negative for dysuria.  Musculoskeletal: Negative.  Negative for back pain.  Skin: Negative.   Neurological: Negative for  dizziness, tremors, speech change, focal weakness, seizures and headaches.  Endo/Heme/Allergies: Negative.  Does not bruise/bleed easily.  Psychiatric/Behavioral: Negative.  Negative for depression, hallucinations and suicidal ideas.     PHYSICAL EXAMINATION:  GENERAL:  85 y.o.-year-old patient lying in the bed with no acute distress.  NECK:  Supple, no jugular venous distention. No thyroid enlargement, no tenderness.  LUNGS: Normal breath sounds bilaterally, no wheezing, rales,rhonchi  No use of accessory muscles of respiration.  CARDIOVASCULAR: S1, S2 normal. No murmurs, rubs, or gallops.  ABDOMEN: Soft, non-tender, non-distended. Bowel sounds present. No organomegaly or mass.  EXTREMITIES: No pedal edema, cyanosis, or clubbing.  PSYCHIATRIC: The patient is alert and oriented x 3.  SKIN: No obvious rash, lesion, or ulcer.   DATA REVIEW:   CBC Recent Labs  Lab 08/15/18 0445  WBC 7.3  HGB 11.8*  HCT 36.1  PLT 218    Chemistries  Recent Labs  Lab 08/16/18 0756  NA 141  K 3.7  CL 109  CO2 24  GLUCOSE 99  BUN 23  CREATININE 1.21*  CALCIUM 8.5*    Cardiac Enzymes Recent Labs  Lab 08/14/18 1908 08/14/18 2319 08/15/18 0445  TROPONINI 0.05* 0.05* 0.06*    Microbiology Results  @MICRORSLT48 @  RADIOLOGY:  No results found.    Allergies as of 08/17/2018      Reactions   Norvasc [amlodipine Besylate] Swelling   Morphine And Related Rash      Medication List    STOP taking these medications   azithromycin 500 MG tablet Commonly known as:  ZITHROMAX     TAKE these medications   albuterol 108 (90 Base) MCG/ACT inhaler Commonly known as:  PROVENTIL HFA;VENTOLIN HFA Inhale 1-2 puffs into the lungs every 4 (four) hours as needed for wheezing or shortness of breath.   furosemide 20 MG tablet Commonly known as:  LASIX Take 1 tablet (20 mg total) by mouth daily.   losartan 100 MG tablet Commonly known as:  COZAAR Take 1 tablet (100 mg total) by mouth  daily. Start taking on:  August 18, 2018   LUMIGAN 0.01 % Soln Generic drug:  bimatoprost Place 1 drop into the left eye at bedtime.   meloxicam 7.5 MG tablet Commonly known as:  MOBIC Take 1 tablet (7.5 mg total) by mouth daily.   metoprolol succinate 50 MG 24 hr tablet Commonly known as:  TOPROL-XL Take 1 tablet (50 mg total) by mouth daily. Take with or immediately following a meal. Start taking on:  August 18, 2018          Management plans discussed with the patient and she is in agreement. Stable for discharge   Patient should follow up with cardiology  CODE STATUS:     Code Status Orders  (From admission, onward)         Start     Ordered   08/14/18 2154  Do not attempt resuscitation (DNR)  Continuous  Question Answer Comment  In the event of cardiac or respiratory ARREST Do not call a "code blue"   In the event of cardiac or respiratory ARREST Do not perform Intubation, CPR, defibrillation or ACLS   In the event of cardiac or respiratory ARREST Use medication by any route, position, wound care, and other measures to relive pain and suffering. May use oxygen, suction and manual treatment of airway obstruction as needed for comfort.   Comments nurse may pronounce      08/14/18 2154        Code Status History    This patient has a current code status but no historical code status.    Advance Directive Documentation     Most Recent Value  Type of Advance Directive  Healthcare Power of Attorney, Living will  Pre-existing out of facility DNR order (yellow form or pink MOST form)  -  "MOST" Form in Place?  -      TOTAL TIME TAKING CARE OF THIS PATIENT: 38 minutes.    Note: This dictation was prepared with Dragon dictation along with smaller phrase technology. Any transcriptional errors that result from this process are unintentional.  Melchizedek Espinola M.D on 08/17/2018 at 11:44 AM  Between 7am to 6pm - Pager - [phone] After 6pm go to [email or web address] -  password [email or web address] [facility]  Office  [phone]  CC: Primary care physician; Crecencio Mc, MD

## 2018-08-18 DIAGNOSIS — I272 Pulmonary hypertension, unspecified: Secondary | ICD-10-CM | POA: Diagnosis not present

## 2018-08-18 DIAGNOSIS — I248 Other forms of acute ischemic heart disease: Secondary | ICD-10-CM | POA: Diagnosis not present

## 2018-08-18 DIAGNOSIS — I5043 Acute on chronic combined systolic (congestive) and diastolic (congestive) heart failure: Secondary | ICD-10-CM | POA: Diagnosis not present

## 2018-08-18 DIAGNOSIS — I130 Hypertensive heart and chronic kidney disease with heart failure and stage 1 through stage 4 chronic kidney disease, or unspecified chronic kidney disease: Secondary | ICD-10-CM | POA: Diagnosis not present

## 2018-08-18 DIAGNOSIS — E1122 Type 2 diabetes mellitus with diabetic chronic kidney disease: Secondary | ICD-10-CM | POA: Diagnosis not present

## 2018-08-18 DIAGNOSIS — Z79899 Other long term (current) drug therapy: Secondary | ICD-10-CM | POA: Diagnosis not present

## 2018-08-18 DIAGNOSIS — N189 Chronic kidney disease, unspecified: Secondary | ICD-10-CM | POA: Diagnosis not present

## 2018-08-18 DIAGNOSIS — Z9981 Dependence on supplemental oxygen: Secondary | ICD-10-CM | POA: Diagnosis not present

## 2018-08-18 DIAGNOSIS — Z87891 Personal history of nicotine dependence: Secondary | ICD-10-CM | POA: Diagnosis not present

## 2018-08-18 DIAGNOSIS — I700 Atherosclerosis of aorta: Secondary | ICD-10-CM | POA: Diagnosis not present

## 2018-08-19 ENCOUNTER — Telehealth: Payer: Self-pay | Admitting: Internal Medicine

## 2018-08-19 NOTE — Telephone Encounter (Signed)
Transition Care Management Follow-up Telephone Call  How have you been since you were released from the hospital? Patient feeling better, has caregiver staying with her 12 hours per day, patient refused rehab, or SNF.   Do you understand why you were in the hospital? yes   Do you understand the discharge instrcutions? yes  Items Reviewed:  Medications reviewed: yes  Allergies reviewed: yes  Dietary changes reviewed: yes  Referrals reviewed: yes   Functional Questionnaire:   Activities of Daily Living (ADLs):   She states they are independent in the following: Grooming, Continence, feeding. States they require assistance with the following: ambulation, bathing and hygiene and dressing Due to weakness andfatigue   Any transportation issues/concerns?: no   Any patient concerns? no   Confirmed importance and date/time of follow-up visits scheduled: yes   Confirmed with patient if condition begins to worsen call PCP or go to the ER.  Patient was given the Call-a-Nurse line [phone]: yes

## 2018-08-19 NOTE — Telephone Encounter (Signed)
Rebekah Ball has been informed/ verbal given.

## 2018-08-19 NOTE — Telephone Encounter (Signed)
Copied from [place] [id number]. Topic: Quick Communication - Home Health Verbal Orders >> Aug 19, 2018 11:01 AM Nils Flack wrote: Caller/Agency[facility]  Callback Number: [phone] Requesting OT/PT/Skilled Nursing/Social Work: nursing  Frequency: 2 week 2  1 week 4 1 every other week 3

## 2018-08-20 ENCOUNTER — Telehealth: Payer: Self-pay | Admitting: Internal Medicine

## 2018-08-20 ENCOUNTER — Other Ambulatory Visit: Payer: Self-pay

## 2018-08-20 DIAGNOSIS — I130 Hypertensive heart and chronic kidney disease with heart failure and stage 1 through stage 4 chronic kidney disease, or unspecified chronic kidney disease: Secondary | ICD-10-CM | POA: Diagnosis not present

## 2018-08-20 DIAGNOSIS — I248 Other forms of acute ischemic heart disease: Secondary | ICD-10-CM | POA: Diagnosis not present

## 2018-08-20 DIAGNOSIS — N189 Chronic kidney disease, unspecified: Secondary | ICD-10-CM | POA: Diagnosis not present

## 2018-08-20 DIAGNOSIS — E1122 Type 2 diabetes mellitus with diabetic chronic kidney disease: Secondary | ICD-10-CM | POA: Diagnosis not present

## 2018-08-20 DIAGNOSIS — I272 Pulmonary hypertension, unspecified: Secondary | ICD-10-CM | POA: Diagnosis not present

## 2018-08-20 DIAGNOSIS — I5043 Acute on chronic combined systolic (congestive) and diastolic (congestive) heart failure: Secondary | ICD-10-CM | POA: Diagnosis not present

## 2018-08-20 NOTE — Patient Outreach (Signed)
[facility] Care Management  08/20/2018  Ina Scrivens Raatz 12/12/1920 [id number]   EMMI-GENERAL DISCHARGE  RED ON EMMI ALERT Day # 1 Date: 08/19/18   2:37 PM Red Alert Reason: "Got discharge papers? I don't know"   Outreach attempt # 1 successful.    Spoke with patient, HIPAA verified.   Reviewed and addressed red alert. Patient states she is doing well. She is unsure if she received d/c paper work but states her son and daughter in law ensure she has all needed services in place. Patient currently has a caregiver for 12 hrs per day. She has a Games developer and PT for weekly visits. Her caregiver is assisting with meals and bathing, she wears an emergency life alert necklace. Patient states she has a pill box and is taking her medications as prescribed, she denies having financial hardship. Patient currently lives in a retirement community, she lives alone, her son and daughter live in [place] and provide transportation for her MD appts. Patient is unaware of her f/u appt with the [facility] set for tomorrow at [facility]. Patient asked that I let her son Herbie [place] know but she does not plan on attending. Patient states "I am puny but I feel great". She denies having further questions or concerns at this time.   Call placed to son Letesha Klecker [phone]. Provided Mr. Nissen with the appointment time, location and provider name set for tomorrow at the [facility] at [facility], he was unaware. He states he may need to reschedule due to it will be difficult getting his mother to attend. He is aware of the PCP f/u set for 08/28/18.    Advised son Herbie [place] patient may receive 1 additional automated EMMI-GENERAL post discharge call and may receive another call from a nurse if her responses are abnormal. He voiced understanding and is appreciative of f/u call.   Plan: RN CM will close case due patient is stable and no CM needs are identified at this time.     Barb Merino,  RN,CCM [facility] Care Management Care Management Coordinator Direct Phone: [phone] Toll Free: [phone] Fax: [phone]

## 2018-08-20 NOTE — Progress Notes (Deleted)
   Patient ID: Rebekah Ball, female    DOB: May 07, 1921, 85 y.o.   MRN: [id number]  HPI  Rebekah Ball is a 85 y/o female with a history of  Echo report from 08/15/18 reviewed and showed an EF of 45-50% along with mild AS/ AR/ MR and mildly elevated PA pressure of 49 mm Hg.   Admitted 08/14/18 due to HF exacerbation. Cardiology consult obtained. Initially needed IV lasix and then transitioned to oral diuretics. Elevated troponin thought to be due to demand ischemia. Discharged after 3 days.   She presents today for her initial visit with a chief complaint of   Review of Systems    Physical Exam    Assessment & Plan:  1: Chronic heart failure with preserved ejection fraction- - NYHA class - BNP 08/14/18 was 2221.0  2: HTN- -  BP  -  BMP from 08/16/18 reviewed and showed sodium 141, potassium 3.7, creatinine 1.21 and GFR 38

## 2018-08-20 NOTE — Telephone Encounter (Signed)
Copied from [place] [id number]. Topic: Quick Communication - Home Health Verbal Orders >> Aug 20, 2018  2:58 PM Leward Quan A wrote: Caller/Agency: Augustin Coupe / [place] Number: [phone] ok to LM Requesting OT/PT/Skilled Nursing/Social Work: PT Frequency: twice a week for 4 weeks

## 2018-08-21 ENCOUNTER — Ambulatory Visit: Payer: Self-pay | Admitting: Family

## 2018-08-21 NOTE — Telephone Encounter (Signed)
Verbal orders given  

## 2018-08-22 ENCOUNTER — Other Ambulatory Visit: Payer: Self-pay | Admitting: Internal Medicine

## 2018-08-22 ENCOUNTER — Ambulatory Visit: Payer: Self-pay

## 2018-08-22 DIAGNOSIS — I130 Hypertensive heart and chronic kidney disease with heart failure and stage 1 through stage 4 chronic kidney disease, or unspecified chronic kidney disease: Secondary | ICD-10-CM | POA: Diagnosis not present

## 2018-08-22 DIAGNOSIS — E1122 Type 2 diabetes mellitus with diabetic chronic kidney disease: Secondary | ICD-10-CM | POA: Diagnosis not present

## 2018-08-22 DIAGNOSIS — I248 Other forms of acute ischemic heart disease: Secondary | ICD-10-CM | POA: Diagnosis not present

## 2018-08-22 DIAGNOSIS — N189 Chronic kidney disease, unspecified: Secondary | ICD-10-CM | POA: Diagnosis not present

## 2018-08-22 DIAGNOSIS — I272 Pulmonary hypertension, unspecified: Secondary | ICD-10-CM | POA: Diagnosis not present

## 2018-08-22 DIAGNOSIS — I5043 Acute on chronic combined systolic (congestive) and diastolic (congestive) heart failure: Secondary | ICD-10-CM | POA: Diagnosis not present

## 2018-08-22 MED ORDER — METOPROLOL SUCCINATE ER 25 MG PO TB24: 25.0000 mg | ORAL_TABLET | Freq: Every day | ORAL | 5 refills | Status: DC

## 2018-08-22 MED ORDER — ISOSORBIDE MONONITRATE ER 30 MG PO TB24: 30.0000 mg | ORAL_TABLET | Freq: Every day | ORAL | 6 refills | Status: DC

## 2018-08-22 MED ORDER — FUROSEMIDE 20 MG PO TABS: 40.0000 mg | ORAL_TABLET | Freq: Every day | ORAL | 5 refills | Status: DC

## 2018-08-22 NOTE — Telephone Encounter (Signed)
[facility] called to report HR 52. Today on arrival to the patient home.  Ms Jeremiah is present and gave oral permission for me to Talk to [facility] Physical therapist of [facility]. Ms Mortensen denies chest pain and SOB Her BP is 177/84.  She has just taken her morning medications. Her heart rate by palpation is irregular. HR is variable by pulse Oximetry 50's to 60's. Konrad Dolores is calling per protocol to report HR below 60. New parameters can be given by physician only. Per my protocol Home care advice given. [facility] Sarah notified. Message will be routed to office.  Reason for Disposition . [1] Skipped or extra beat(s) AND [2] occurs < 4 times / minute  Answer Assessment - Initial Assessment Questions 1. DESCRIPTION: "Please describe your heart rate or heart beat that you are having" (e.g., fast/slow, regular/irregular, skipped or extra beats, "palpitations")     slow 2. ONSET: "When did it start?" (Minutes, hours or days)      15 Minutes ago to present 3. DURATION: "How long does it last" (e.g., seconds, minutes, hours)     seconds 4. PATTERN "Does it come and go, or has it been constant since it started?"  "Does it get worse with exertion?"   "Are you feeling it now?"     Comes ad goes 5. TAP: "Using your hand, can you tap out what you are feeling on a chair or table in front of you, so that I can hear?" (Note: not all patients can do this)       unable 6. HEART RATE: "Can you tell me your heart rate?" "How many beats in 15 seconds?"  (Note: not all patients can do this)       52 pulse ox now 66 7. RECURRENT SYMPTOM: "Have you ever had this before?" If so, ask: "When was the last time?" and "What happened that time?"     no 8. CAUSE: "What do you think is causing the palpitations?"     not 9. CARDIAC HISTORY: "Do you have any history of heart disease?" (e.g., heart attack, angina, bypass surgery, angioplasty, arrhythmia)      no 10. OTHER SYMPTOMS: "Do you have any other  symptoms?" (e.g., dizziness, chest pain, sweating, difficulty breathing)      A fib 11. PREGNANCY: "Is there any chance you are pregnant?" "When was your last menstrual period?"      N/A  Protocols used: HEART RATE AND HEARTBEAT QUESTIONS-A-AH

## 2018-08-22 NOTE — Telephone Encounter (Signed)
Konrad Dolores stated that patient was SOB after walking today. He tried to gage swelling of legs to see if there was dimpling, but patient extremely sensitive & stated that it was too painful.  He also stated that patient has home health with her for 12 hrs a day this week & drops to 4 hrs next week. They have been helping with medication administration.

## 2018-08-22 NOTE — Telephone Encounter (Signed)
Ask HH to increase her lasix to 20 mg twice daily OR 40 mg daily in the morning.   Reduce the metoprolol dose to 25 mg daily and add Imdur 30 mg daily   ,  I will send new rx's to pharmacy

## 2018-08-22 NOTE — Telephone Encounter (Signed)
I called Tommy LMTCB.

## 2018-08-22 NOTE — Telephone Encounter (Signed)
If she is not short of breath or presyncopal he can continue with PT  I cannot make a medication change without knowing more abou how she takes her meds:  Did she take too many metoprolol and not enough losartan?

## 2018-08-22 NOTE — Telephone Encounter (Signed)
I spoke with the nursing coordinator at [facility] & they asked that I send over new updated med list. I will be faxing to ATTEN: Mardene Celeste. I have faxed.

## 2018-08-26 DIAGNOSIS — I5043 Acute on chronic combined systolic (congestive) and diastolic (congestive) heart failure: Secondary | ICD-10-CM | POA: Diagnosis not present

## 2018-08-26 DIAGNOSIS — I130 Hypertensive heart and chronic kidney disease with heart failure and stage 1 through stage 4 chronic kidney disease, or unspecified chronic kidney disease: Secondary | ICD-10-CM | POA: Diagnosis not present

## 2018-08-26 DIAGNOSIS — N189 Chronic kidney disease, unspecified: Secondary | ICD-10-CM | POA: Diagnosis not present

## 2018-08-26 DIAGNOSIS — E1122 Type 2 diabetes mellitus with diabetic chronic kidney disease: Secondary | ICD-10-CM | POA: Diagnosis not present

## 2018-08-26 DIAGNOSIS — I248 Other forms of acute ischemic heart disease: Secondary | ICD-10-CM | POA: Diagnosis not present

## 2018-08-26 DIAGNOSIS — I272 Pulmonary hypertension, unspecified: Secondary | ICD-10-CM | POA: Diagnosis not present

## 2018-08-28 ENCOUNTER — Telehealth: Payer: Self-pay

## 2018-08-28 ENCOUNTER — Encounter: Payer: Self-pay | Admitting: Internal Medicine

## 2018-08-28 ENCOUNTER — Ambulatory Visit (INDEPENDENT_AMBULATORY_CARE_PROVIDER_SITE_OTHER): Payer: Medicare Other | Admitting: Internal Medicine

## 2018-08-28 VITALS — BP 150/76 | HR 56 | Temp 97.3°F | Resp 16 | Ht 62.0 in | Wt 143.4 lb

## 2018-08-28 DIAGNOSIS — I248 Other forms of acute ischemic heart disease: Secondary | ICD-10-CM | POA: Diagnosis not present

## 2018-08-28 DIAGNOSIS — Z09 Encounter for follow-up examination after completed treatment for conditions other than malignant neoplasm: Secondary | ICD-10-CM | POA: Diagnosis not present

## 2018-08-28 DIAGNOSIS — I130 Hypertensive heart and chronic kidney disease with heart failure and stage 1 through stage 4 chronic kidney disease, or unspecified chronic kidney disease: Secondary | ICD-10-CM | POA: Diagnosis not present

## 2018-08-28 DIAGNOSIS — E1122 Type 2 diabetes mellitus with diabetic chronic kidney disease: Secondary | ICD-10-CM | POA: Diagnosis not present

## 2018-08-28 DIAGNOSIS — N189 Chronic kidney disease, unspecified: Secondary | ICD-10-CM | POA: Diagnosis not present

## 2018-08-28 DIAGNOSIS — I5043 Acute on chronic combined systolic (congestive) and diastolic (congestive) heart failure: Secondary | ICD-10-CM | POA: Diagnosis not present

## 2018-08-28 DIAGNOSIS — I10 Essential (primary) hypertension: Secondary | ICD-10-CM

## 2018-08-28 DIAGNOSIS — I700 Atherosclerosis of aorta: Secondary | ICD-10-CM

## 2018-08-28 DIAGNOSIS — I272 Pulmonary hypertension, unspecified: Secondary | ICD-10-CM | POA: Diagnosis not present

## 2018-08-28 MED ORDER — AMLODIPINE BESYLATE 2.5 MG PO TABS: 2.5000 mg | ORAL_TABLET | Freq: Every day | ORAL | 3 refills | Status: DC

## 2018-08-28 MED ORDER — FUROSEMIDE 20 MG PO TABS: 20.0000 mg | ORAL_TABLET | Freq: Every day | ORAL | 5 refills | Status: DC

## 2018-08-28 NOTE — Telephone Encounter (Signed)
Copied from [place]. Topic: General - Other >> Aug 28, 2018  2:19 PM Carolyn Stare wrote:  Pt son Herbie [place] call and ask for a call back about his Mom appt today

## 2018-08-28 NOTE — Progress Notes (Signed)
Subjective:  Patient ID: Rebekah Ball, female    DOB: 1921/06/11  Age: 85 y.o. MRN: [id number]  CC: The primary encounter diagnosis was Aortic atherosclerosis ([place]). Diagnoses of Demand ischemia [facility]), Essential hypertension, and Hospital discharge follow-up were also pertinent to this visit.  HPI [name] presents for hospital follow up.  Last seen in office in April 2018.  Patient was admitted on  August 14 2018   To [facility]  With hypertensive urgency , elevated troponin, and dyspnea .  admitting diagnosis was acute combined diastolic and systolic heart failure.  ECHO noted an  EF of 45%.  CT chest w/o contrast was done: severe aortic atherosclerosis,  CAD  And cardiomegaly seen,  With bilateral pleural effusions    She was seen by cardiology and treated with IV lasix for pulmonary and interstitial  edema.  She ruled out for AMI .  She was discharged to home on  January 4 with  Home health,  a referral to the CHF clinic and oral daily lasix.  Her HTN regimen was losartan and metoprolol     Lives at [place] and and is accompanied  by a [facility] caregiver who is with her for 12 hours per week . Daily BP readings have been done since discharge and home readings have been elevated to 373 systolic.  She has been compliant with    ER visit in Sept 2019 for fall onto right prosthetic  hip.  No fracture seen .  Wants both children to be put on her DPR  DNR status reviewed. She is adamant about having DNR and has an out of hospital form displayed at home.   Outpatient Medications Prior to Visit  Medication Sig Dispense Refill  . losartan (COZAAR) 100 MG tablet Take 1 tablet (100 mg total) by mouth daily. 30 tablet 0  . LUMIGAN 0.01 % SOLN Place 1 drop into the left eye at bedtime.    . metoprolol succinate (TOPROL-XL) 25 MG 24 hr tablet Take 1 tablet (25 mg total) by mouth daily. Take with or immediately following a meal. 30 tablet 5  . furosemide (LASIX) 20 MG tablet Take 2 tablets (40 mg total)  by mouth daily. 3060 tablet 5  . meloxicam (MOBIC) 7.5 MG tablet Take 1 tablet (7.5 mg total) by mouth daily. 90 tablet 1  . albuterol (PROVENTIL HFA;VENTOLIN HFA) 108 (90 Base) MCG/ACT inhaler Inhale 1-2 puffs into the lungs every 4 (four) hours as needed for wheezing or shortness of breath.    . isosorbide mononitrate (IMDUR) 30 MG 24 hr tablet Take 1 tablet (30 mg total) by mouth daily. (Patient not taking: Reported on 08/28/2018) 30 tablet 6   No facility-administered medications prior to visit.     Review of Systems;  Patient denies headache, fevers, malaise, unintentional weight loss, skin rash, eye pain, sinus congestion and sinus pain, sore throat, dysphagia,  hemoptysis , cough, dyspnea, wheezing, chest pain, palpitations, orthopnea, edema, abdominal pain, nausea, melena, diarrhea, constipation, flank pain, dysuria, hematuria, urinary  Frequency, nocturia, numbness, tingling, seizures,  Focal weakness, Loss of consciousness,  Tremor, insomnia, depression, anxiety, and suicidal ideation.      Objective:  BP (!) 150/76 (BP Location: Left Arm, Patient Position: Sitting, Cuff Size: Normal)   Pulse (!) 56   Temp (!) 97.3 F (36.3 C) (Oral)   Resp 16   Ht 5\' 2"  (1.575 m)   Wt 143 lb 6.4 oz (65 kg)   SpO2 92%  BMI 26.23 kg/m   BP Readings from Last 3 Encounters:  08/28/18 (!) 150/76  08/17/18 (!) 169/95  04/23/18 (!) 180/105    Wt Readings from Last 3 Encounters:  08/28/18 143 lb 6.4 oz (65 kg)  08/17/18 141 lb 1.5 oz (64 kg)  04/23/18 130 lb (59 kg)    General appearance: alert, cooperative and appears stated age Ears: normal TM's and external ear canals both ears Throat: lips, mucosa, and tongue normal; teeth and gums normal Neck: no adenopathy, no carotid bruit, supple, symmetrical, trachea midline and thyroid not enlarged, symmetric, no tenderness/mass/nodules Back: symmetric, no curvature. ROM normal. No CVA tenderness. Lungs: clear to auscultation  bilaterally Heart: regular rate and rhythm, S1, S2 normal, no murmur, click, rub or gallop Abdomen: soft, non-tender; bowel sounds normal; no masses,  no organomegaly Pulses: 2+ and symmetric Skin: Skin color, texture, turgor normal. No rashes or lesions Lymph nodes: Cervical, supraclavicular, and axillary nodes normal.  Lab Results  Component Value Date   HGBA1C 5.1 08/14/2018   HGBA1C 5.6 02/07/2017    Lab Results  Component Value Date   CREATININE 1.21 (H) 08/16/2018   CREATININE 1.17 (H) 08/15/2018   CREATININE 1.22 (H) 08/14/2018    Lab Results  Component Value Date   WBC 7.3 08/15/2018   HGB 11.8 (L) 08/15/2018   HCT 36.1 08/15/2018   PLT 218 08/15/2018   GLUCOSE 99 08/16/2018   CHOL 156 08/16/2018   TRIG 45 08/16/2018   HDL 52 08/16/2018   LDLCALC 95 08/16/2018   ALT 11 01/31/2017   AST 15 01/31/2017   NA 141 08/16/2018   K 3.7 08/16/2018   CL 109 08/16/2018   CREATININE 1.21 (H) 08/16/2018   BUN 23 08/16/2018   CO2 24 08/16/2018   TSH 4.80 (H) 11/01/2015   INR 1.0 01/10/2014   HGBA1C 5.1 08/14/2018    No results found.  Assessment & Plan:   Problem List Items Addressed This Visit    Aortic atherosclerosis ([place]) - Primary   Relevant Medications   furosemide (LASIX) 20 MG tablet   isosorbide mononitrate (IMDUR) 30 MG 24 hr tablet   Other Relevant Orders   DNR (Do Not Resuscitate)   Demand ischemia ([place])    She had elevated troponins during admission and dyspnea secondary to pulmonary edema.  Imdur was added per hospital discharge, but not started.  Given her elevated blood pressure readings,  Will start Imdur.       Relevant Medications   furosemide (LASIX) 20 MG tablet   isosorbide mononitrate (IMDUR) 30 MG 24 hr tablet   Hospital discharge follow-up    Patient is stable post discharge .  All ongoing issues and  questions about discharge plans at the visit today were addressed. All labs , imaging studies and progress notes from admission were  reviewed with patient today        Hypertension    Not at  Goal on metoprolol and losartan.  Both medications are at maximal doses given her HR of 56.  She apparently has had severe skin reaction to amlodipine (not previously noted in chart or voiced by patient,  But reported by son via phone call) so the medication will be stopped and Imdur started       Relevant Medications   furosemide (LASIX) 20 MG tablet   isosorbide mononitrate (IMDUR) 30 MG 24 hr tablet      I have discontinued Analiese J. Hengst's meloxicam. I have also changed her furosemide. Additionally,  I am having her maintain her LUMIGAN, albuterol, losartan, metoprolol succinate, and isosorbide mononitrate.  Meds ordered this encounter  Medications  . furosemide (LASIX) 20 MG tablet    Sig: Take 1 tablet (20 mg total) by mouth daily.    Dispense:  90 tablet    Refill:  5    Keep on file for future refills  . DISCONTD: amLODipine (NORVASC) 2.5 MG tablet    Sig: Take 1 tablet (2.5 mg total) by mouth daily.    Dispense:  90 tablet    Refill:  3  . isosorbide mononitrate (IMDUR) 30 MG 24 hr tablet    Sig: Take 1 tablet (30 mg total) by mouth daily.    Dispense:  30 tablet    Refill:  6    Medications Discontinued During This Encounter  Medication Reason  . furosemide (LASIX) 20 MG tablet   . meloxicam (MOBIC) 7.5 MG tablet   . isosorbide mononitrate (IMDUR) 30 MG 24 hr tablet Reorder    Follow-up: Return in about 6 months (around 02/26/2019).   Crecencio Mc, MD

## 2018-08-28 NOTE — Patient Instructions (Addendum)
Your blood pressure  Is still too high   Change the metoprolol to nighttime dosing.     Add Imdur 30 mg in the mornring  Continue furosemide 20 mg   and losartan 100 mg  in the morning    avoid using meloxicam, motrin ,  advil and aleve except on rare occasions.  They can harm your kidneys  and cause ulcers in your stomach  You can take up to 2000 mg of acetominophen (tylenol) every day safely  In divided doses (500 mg every 6 hours  Or 1000 mg every 12 hours.)

## 2018-08-29 DIAGNOSIS — Z09 Encounter for follow-up examination after completed treatment for conditions other than malignant neoplasm: Secondary | ICD-10-CM | POA: Insufficient documentation

## 2018-08-29 MED ORDER — ISOSORBIDE MONONITRATE ER 30 MG PO TB24: 30.0000 mg | ORAL_TABLET | Freq: Every day | ORAL | 6 refills | Status: DC

## 2018-08-29 NOTE — Telephone Encounter (Signed)
Spoke with pt's son to let him know that it would be a good idea for him and his wife to set up mychart for the pt so that they can keep up with the pt's medical information. I then read over the AVS with the son and he stated that the pt is allergic to Amlodipine and that is doesn't just cause swelling. He stated that the pt breaks out in "ulcers" on her arms that bleed. Son would like for [place] to call the pharmacy and discontinue this medication because the pt gets pill packs and wouldn't know which pill not to take.

## 2018-08-29 NOTE — Assessment & Plan Note (Signed)
Not at  Goal on metoprolol and losartan.  Both medications are at maximal doses given her HR of 56.  She apparently has had severe skin reaction to amlodipine (not previously noted in chart or voiced by patient,  But reported by son via phone call) so the medication will be stopped and Imdur started

## 2018-08-29 NOTE — Assessment & Plan Note (Signed)
Patient is stable post discharge .  All ongoing issues and  questions about discharge plans at the visit today were addressed. All labs , imaging studies and progress notes from admission were reviewed with patient today

## 2018-08-29 NOTE — Assessment & Plan Note (Addendum)
She had elevated troponins during admission and dyspnea secondary to pulmonary edema.  Imdur was added per hospital discharge, but not started.  Given her elevated blood pressure readings,  Will start Imdur.

## 2018-08-29 NOTE — Telephone Encounter (Signed)
Please call pharmacy ASAP and cancel the amlodipine

## 2018-08-29 NOTE — Telephone Encounter (Signed)
Rebekah Ball with [facility] calling and states the isosorbide mononitrate (IMDUR) 30 MG 24 hr tablet Should already be in the pts pill pack as she was filled this on 08/22/2018.

## 2018-08-30 ENCOUNTER — Ambulatory Visit: Payer: Self-pay | Admitting: *Deleted

## 2018-08-30 DIAGNOSIS — I272 Pulmonary hypertension, unspecified: Secondary | ICD-10-CM | POA: Diagnosis not present

## 2018-08-30 DIAGNOSIS — E1122 Type 2 diabetes mellitus with diabetic chronic kidney disease: Secondary | ICD-10-CM | POA: Diagnosis not present

## 2018-08-30 DIAGNOSIS — I130 Hypertensive heart and chronic kidney disease with heart failure and stage 1 through stage 4 chronic kidney disease, or unspecified chronic kidney disease: Secondary | ICD-10-CM | POA: Diagnosis not present

## 2018-08-30 DIAGNOSIS — I248 Other forms of acute ischemic heart disease: Secondary | ICD-10-CM | POA: Diagnosis not present

## 2018-08-30 DIAGNOSIS — I5043 Acute on chronic combined systolic (congestive) and diastolic (congestive) heart failure: Secondary | ICD-10-CM | POA: Diagnosis not present

## 2018-08-30 DIAGNOSIS — N189 Chronic kidney disease, unspecified: Secondary | ICD-10-CM | POA: Diagnosis not present

## 2018-08-30 MED ORDER — ALBUTEROL SULFATE HFA 108 (90 BASE) MCG/ACT IN AERS: 1.0000 | INHALATION_SPRAY | RESPIRATORY_TRACT | 2 refills | Status: DC | PRN

## 2018-08-30 NOTE — Telephone Encounter (Signed)
Summary: Current Medication List Confirmation   Pt's nurse with [facility] called to confirm current medications for the patient. Some medications she named were not listed on current med list. Please advise. [phone]     Nurse is reporting that a Rx needs to be forwarded for albuterol- [facility].They do not have that current Rx for patient.  Reason for Disposition . Caller requesting a NON-URGENT new prescription or refill and triager unable to refill per unit policy  Protocols used: MEDICATION QUESTION CALL-A-AH

## 2018-08-30 NOTE — Telephone Encounter (Signed)
Spoke with Mardene Celeste, RN with [facility] and went over the medications with her. She stated that she spoke with [facility] and she took out all the amlodipine from the pt's pill packs. Also refilled the pt's albuterol inhaler.

## 2018-08-30 NOTE — Addendum Note (Signed)
Addended by: Adair Laundry on: 08/30/2018 04:31 PM   Modules accepted: Orders

## 2018-09-02 DIAGNOSIS — I5043 Acute on chronic combined systolic (congestive) and diastolic (congestive) heart failure: Secondary | ICD-10-CM | POA: Diagnosis not present

## 2018-09-02 DIAGNOSIS — N189 Chronic kidney disease, unspecified: Secondary | ICD-10-CM | POA: Diagnosis not present

## 2018-09-02 DIAGNOSIS — I130 Hypertensive heart and chronic kidney disease with heart failure and stage 1 through stage 4 chronic kidney disease, or unspecified chronic kidney disease: Secondary | ICD-10-CM | POA: Diagnosis not present

## 2018-09-02 DIAGNOSIS — I248 Other forms of acute ischemic heart disease: Secondary | ICD-10-CM | POA: Diagnosis not present

## 2018-09-02 DIAGNOSIS — I272 Pulmonary hypertension, unspecified: Secondary | ICD-10-CM | POA: Diagnosis not present

## 2018-09-02 DIAGNOSIS — E1122 Type 2 diabetes mellitus with diabetic chronic kidney disease: Secondary | ICD-10-CM | POA: Diagnosis not present

## 2018-09-05 DIAGNOSIS — N189 Chronic kidney disease, unspecified: Secondary | ICD-10-CM | POA: Diagnosis not present

## 2018-09-05 DIAGNOSIS — I248 Other forms of acute ischemic heart disease: Secondary | ICD-10-CM | POA: Diagnosis not present

## 2018-09-05 DIAGNOSIS — I130 Hypertensive heart and chronic kidney disease with heart failure and stage 1 through stage 4 chronic kidney disease, or unspecified chronic kidney disease: Secondary | ICD-10-CM | POA: Diagnosis not present

## 2018-09-05 DIAGNOSIS — I5043 Acute on chronic combined systolic (congestive) and diastolic (congestive) heart failure: Secondary | ICD-10-CM | POA: Diagnosis not present

## 2018-09-05 DIAGNOSIS — E1122 Type 2 diabetes mellitus with diabetic chronic kidney disease: Secondary | ICD-10-CM | POA: Diagnosis not present

## 2018-09-05 DIAGNOSIS — I272 Pulmonary hypertension, unspecified: Secondary | ICD-10-CM | POA: Diagnosis not present

## 2018-09-08 NOTE — Progress Notes (Signed)
Patient ID: Rebekah Ball, female    DOB: [birth year], 85 y.o.   MRN: [id number]  HPI  Rebekah Ball is a 85 y/o female with a history of HTN, anemia, previous tobacco use and chronic heart failure.   Echo report from 08/15/18 reviewed and showed an EF of 45-50% along with mild AS/ AR/ MR and mildly elevated PA pressure of 49 mm Hg.   Admitted 08/14/18 due to HF exacerbation. Cardiology consult obtained. Initially needed IV lasix and then transitioned to oral diuretics. Elevated troponin thought to be due to demand ischemia. Discharged after 3 days.   She presents today for her initial visit with a chief complaint of minimal fatigue upon moderate exertion. She describes this as chronic in nature having been present for several years. She has associated pedal edema during the day. She denies any difficulty sleeping, dizziness, abdominal distention, palpitations, chest pain, shortness of breath, cough or weight gain. On D/C summary it said she was to follow up with Dr. Rockey Situ but patient currently doesn't have an appointment scheduled with him. She lives at [facility] at [place] and has an aide that assists with some ADL's. Has 3 different papers with differing medications listed on them.   Past Medical History:  Diagnosis Date  . Anemia   . Arthritis   . CHF (congestive heart failure) ([place])   . Hypertension   . Macular degeneration    Past Surgical History:  Procedure Laterality Date  . ABDOMINAL HYSTERECTOMY    . ABDOMINAL SURGERY    . BLADDER SURGERY    . CARPAL TUNNEL RELEASE    . CESAREAN SECTION     x4  . COLON SURGERY    . FRACTURE SURGERY    . JOINT REPLACEMENT     right hip, bilateral knees  . TRIGGER FINGER RELEASE     Family History  Problem Relation Age of Onset  . Kidney disease Mother   . CAD Father    Social History   Tobacco Use  . Smoking status: Former Research scientist (life sciences)  . Smokeless tobacco: Never Used  Substance Use Topics  . Alcohol use: Yes    Comment: glass of wine q  3-4 months   Allergies  Allergen Reactions  . Norvasc [Amlodipine Besylate] Swelling and Rash    With ulcerations (per son)  . Morphine And Related Rash   Prior to Admission medications   Medication Sig Start Date End Date Taking? Authorizing Provider  albuterol (PROVENTIL HFA;VENTOLIN HFA) 108 (90 Base) MCG/ACT inhaler Inhale 1-2 puffs into the lungs every 4 (four) hours as needed for wheezing or shortness of breath. 08/30/18  Yes Crecencio Mc, MD  furosemide (LASIX) 20 MG tablet Take 1 tablet (20 mg total) by mouth daily. 08/28/18 08/28/19 Yes Crecencio Mc, MD  isosorbide mononitrate (IMDUR) 30 MG 24 hr tablet Take 1 tablet (30 mg total) by mouth daily. 08/29/18  Yes Crecencio Mc, MD  losartan (COZAAR) 100 MG tablet Take 1 tablet (100 mg total) by mouth daily. 08/18/18  Yes Mody, Sital, MD  LUMIGAN 0.01 % SOLN Place 1 drop into the left eye at bedtime. 12/19/14  Yes [provider]  metoprolol succinate (TOPROL-XL) 25 MG 24 hr tablet Take 1 tablet (25 mg total) by mouth daily. Take with or immediately following a meal. 08/22/18   Crecencio Mc, MD    Review of Systems  Constitutional: Positive for fatigue (minimal). Negative for appetite change.  HENT: Positive for rhinorrhea. Negative for  congestion, postnasal drip and sore throat.   Eyes: Negative.   Respiratory: Negative for cough and shortness of breath.   Cardiovascular: Positive for leg swelling (during the day). Negative for chest pain and palpitations.  Gastrointestinal: Negative for abdominal distention and abdominal pain.  Endocrine: Negative.   Genitourinary: Negative.   Musculoskeletal: Negative for back pain and neck pain.  Skin: Negative.   Allergic/Immunologic: Negative.   Neurological: Negative for dizziness and light-headedness.  Hematological: Negative for adenopathy. Does not bruise/bleed easily.  Psychiatric/Behavioral: Negative for dysphoric mood and sleep disturbance (sleeping on 1 pillow). The  patient is not nervous/anxious.    Vitals:   09/09/18 1104  BP: (!) 150/80  Pulse: (!) 56  Resp: 18  SpO2: 96%  Weight: 143 lb 2 oz (64.9 kg)  Height: 5\' 2"  (1.575 m)   Wt Readings from Last 3 Encounters:  09/09/18 143 lb 2 oz (64.9 kg)  08/28/18 143 lb 6.4 oz (65 kg)  08/17/18 141 lb 1.5 oz (64 kg)   Lab Results  Component Value Date   CREATININE 1.21 (H) 08/16/2018   CREATININE 1.17 (H) 08/15/2018   CREATININE 1.22 (H) 08/14/2018    Physical Exam Vitals signs and nursing note reviewed.  Constitutional:      Appearance: Normal appearance.  HENT:     Head: Normocephalic and atraumatic.  Neck:     Musculoskeletal: Normal range of motion and neck supple.  Cardiovascular:     Rate and Rhythm: Regular rhythm. Bradycardia present.  Pulmonary:     Effort: Pulmonary effort is normal. No respiratory distress.     Breath sounds: No wheezing or rales.  Abdominal:     General: Abdomen is flat.     Palpations: Abdomen is soft.  Musculoskeletal:     Right lower leg: No edema.     Left lower leg: No edema.  Skin:    General: Skin is warm and dry.  Neurological:     General: No focal deficit present.     Mental Status: She is alert and oriented to person, place, and time.  Psychiatric:        Mood and Affect: Mood normal.        Behavior: Behavior normal.        Thought Content: Thought content normal.    Assessment & Plan:  1: Chronic heart failure with mildly reduced ejection fraction- - NYHA class II - euvolemic today - weighing twice daily; explained that she only needed to weigh in the mornings and call for an overnight weight gain of >2 pounds or a weekly weight gain of >5 pounds - not adding salt and the importance of closely following a 2000mg  sodium diet was emphasized and written dietary information was given to her about this - at discharge, she was supposed to make an appointment with Morgan Rockey Situ); this was scheduled for her today for 09/20/2018 (patient's son  was informed) - EF >40% so would not qualify for entresto - BNP 08/14/18 was 2221.0 - says that she's received her flu vaccine for this season already  2: HTN- -  BP elevated today (150/80); isosorbide recently started by PCP - saw PCP Derrel Nip) 08/28/2018 - BMP from 08/16/18 reviewed and showed sodium 141, potassium 3.7, creatinine 1.21 and GFR 38  Medication list was reviewed although the 3 different papers that were brought had discrepancies. Son was called per patient's request and says that Dr. Derrel Nip would have the correct list.  Patient to return in 6 weeks or  sooner for any questions/problems before then.

## 2018-09-09 ENCOUNTER — Encounter: Payer: Self-pay | Admitting: Family

## 2018-09-09 ENCOUNTER — Ambulatory Visit: Payer: Medicare Other | Attending: Family | Admitting: Family

## 2018-09-09 VITALS — BP 150/80 | HR 56 | Resp 18 | Ht 62.0 in | Wt 143.1 lb

## 2018-09-09 DIAGNOSIS — Z87891 Personal history of nicotine dependence: Secondary | ICD-10-CM | POA: Insufficient documentation

## 2018-09-09 DIAGNOSIS — I5022 Chronic systolic (congestive) heart failure: Secondary | ICD-10-CM | POA: Diagnosis not present

## 2018-09-09 DIAGNOSIS — D649 Anemia, unspecified: Secondary | ICD-10-CM | POA: Insufficient documentation

## 2018-09-09 DIAGNOSIS — Z885 Allergy status to narcotic agent status: Secondary | ICD-10-CM | POA: Insufficient documentation

## 2018-09-09 DIAGNOSIS — Z79899 Other long term (current) drug therapy: Secondary | ICD-10-CM | POA: Diagnosis not present

## 2018-09-09 DIAGNOSIS — I10 Essential (primary) hypertension: Secondary | ICD-10-CM

## 2018-09-09 DIAGNOSIS — I110 Hypertensive heart disease with heart failure: Secondary | ICD-10-CM | POA: Diagnosis not present

## 2018-09-09 DIAGNOSIS — Z8249 Family history of ischemic heart disease and other diseases of the circulatory system: Secondary | ICD-10-CM | POA: Insufficient documentation

## 2018-09-09 NOTE — Patient Instructions (Signed)
Continue weighing daily and call for an overnight weight gain of > 2 pounds or a weekly weight gain of >5 pounds. 

## 2018-09-10 DIAGNOSIS — I130 Hypertensive heart and chronic kidney disease with heart failure and stage 1 through stage 4 chronic kidney disease, or unspecified chronic kidney disease: Secondary | ICD-10-CM

## 2018-09-10 DIAGNOSIS — Z87891 Personal history of nicotine dependence: Secondary | ICD-10-CM

## 2018-09-10 DIAGNOSIS — N189 Chronic kidney disease, unspecified: Secondary | ICD-10-CM

## 2018-09-10 DIAGNOSIS — I272 Pulmonary hypertension, unspecified: Secondary | ICD-10-CM

## 2018-09-10 DIAGNOSIS — E1122 Type 2 diabetes mellitus with diabetic chronic kidney disease: Secondary | ICD-10-CM

## 2018-09-10 DIAGNOSIS — I5043 Acute on chronic combined systolic (congestive) and diastolic (congestive) heart failure: Secondary | ICD-10-CM

## 2018-09-10 DIAGNOSIS — Z79899 Other long term (current) drug therapy: Secondary | ICD-10-CM

## 2018-09-10 DIAGNOSIS — Z9981 Dependence on supplemental oxygen: Secondary | ICD-10-CM | POA: Diagnosis not present

## 2018-09-10 DIAGNOSIS — I700 Atherosclerosis of aorta: Secondary | ICD-10-CM

## 2018-09-10 DIAGNOSIS — I248 Other forms of acute ischemic heart disease: Secondary | ICD-10-CM | POA: Diagnosis not present

## 2018-09-13 DIAGNOSIS — N189 Chronic kidney disease, unspecified: Secondary | ICD-10-CM | POA: Diagnosis not present

## 2018-09-13 DIAGNOSIS — I130 Hypertensive heart and chronic kidney disease with heart failure and stage 1 through stage 4 chronic kidney disease, or unspecified chronic kidney disease: Secondary | ICD-10-CM | POA: Diagnosis not present

## 2018-09-13 DIAGNOSIS — I248 Other forms of acute ischemic heart disease: Secondary | ICD-10-CM | POA: Diagnosis not present

## 2018-09-13 DIAGNOSIS — E1122 Type 2 diabetes mellitus with diabetic chronic kidney disease: Secondary | ICD-10-CM | POA: Diagnosis not present

## 2018-09-13 DIAGNOSIS — I272 Pulmonary hypertension, unspecified: Secondary | ICD-10-CM | POA: Diagnosis not present

## 2018-09-13 DIAGNOSIS — I5043 Acute on chronic combined systolic (congestive) and diastolic (congestive) heart failure: Secondary | ICD-10-CM | POA: Diagnosis not present

## 2018-09-16 NOTE — Progress Notes (Addendum)
Cardiology Office Note  Date:  09/20/2018   ID:  Rebekah Ball, DOB 03/21/1921, MRN [id number]  PCP:  Crecencio Mc, MD   Chief Complaint  Patient presents with  . other    Hosp. f/u 08/14/2018. SOB with exertion. Medications reviewed verbally with patient.    HPI:  85 year old woman with no prior cardiac history who lives in [place] at nursing facility independent living  Diabetes type 2 presenting to the hospital early January 2020 with acute respiratory distress  Markedly elevated blood pressure on arrival 829 systolic in the emergency room Treated with diuretics Ejection fraction 45% on echocardiogram with moderately elevated right heart pressures --CT scan with moderate to severe aortic atherosclerosis,  three-vessel coronary calcification at least moderate Who presents today for follow-up of her hypertensive heart disease and CHF  patient's son and daughter-in-law who live in [place]   Discharged on losartan, metoprolol with diuresis  She had minimally elevated troponin in the hospital concerning for demand ischemia Prior recommendation was for outpatient stress testing Restarted on a statin  She was seen by Dr. Derrel Nip on August 28, 2018, blood pressure elevated Started on isosorbide 30 mg daily Amlodipine held  On today's visit she denies any significant shortness of breath No significant lower extremity edema She did not bring her blood pressure measurements from home Her son who does not present with her today felt that she needed a caretaker at home.  Caretaker presents with her today Patient walks with a walker, walks fast, legs feel weak but no recent falls  No PND orthopnea  No recent lab work since she left the hospital EKG personally reviewed by myself on todays visit Shows normal sinus rhythm with rate 59 bpm LVH   PMH:   has a past medical history of Anemia, Arthritis, CHF (congestive heart failure) ([place]), Hypertension, and Macular  degeneration.  PSH:    Past Surgical History:  Procedure Laterality Date  . ABDOMINAL HYSTERECTOMY    . ABDOMINAL SURGERY    . BLADDER SURGERY    . CARPAL TUNNEL RELEASE    . CESAREAN SECTION     x4  . COLON SURGERY    . FRACTURE SURGERY    . JOINT REPLACEMENT     right hip, bilateral knees  . TRIGGER FINGER RELEASE      Current Outpatient Medications  Medication Sig Dispense Refill  . furosemide (LASIX) 20 MG tablet Take 1 tablet (20 mg total) by mouth daily. 90 tablet 5  . isosorbide mononitrate (IMDUR) 30 MG 24 hr tablet Take 1 tablet (30 mg total) by mouth daily. 30 tablet 6  . losartan (COZAAR) 100 MG tablet Take 1 tablet (100 mg total) by mouth daily. 90 tablet 1  . LUMIGAN 0.01 % SOLN Place 1 drop into the left eye at bedtime.    . metoprolol succinate (TOPROL-XL) 25 MG 24 hr tablet Take 1 tablet (25 mg total) by mouth daily. Take with or immediately following a meal. 30 tablet 5   No current facility-administered medications for this visit.      Allergies:   Norvasc [amlodipine besylate] and Morphine and related   Social History:  The patient  reports that she has quit smoking. She has never used smokeless tobacco. She reports current alcohol use. She reports that she does not use drugs.   Family History:   family history includes CAD in her father; Kidney disease in her mother.    Review of Systems: Review of Systems  Constitutional:  Negative.   Respiratory: Negative.   Cardiovascular: Negative.   Gastrointestinal: Negative.   Musculoskeletal: Negative.        Gait instability  Neurological: Negative.   Psychiatric/Behavioral: Negative.   All other systems reviewed and are negative.    PHYSICAL EXAM: VS:  BP (!) 144/82 (BP Location: Left Arm, Patient Position: Lying right side, Cuff Size: Normal)   Pulse (!) 59   Wt 138 lb (62.6 kg)   BMI 25.24 kg/m  , BMI Body mass index is 25.24 kg/m. GEN: Well nourished, well developed, in no acute distress   HEENT: normal  Neck: no JVD, carotid bruits, or masses Cardiac: RRR; no murmurs, rubs, or gallops,no edema  Respiratory:  clear to auscultation bilaterally, normal work of breathing GI: soft, nontender, nondistended, + BS MS: no deformity or atrophy  Skin: warm and dry, no rash Neuro:  Strength and sensation are intact Psych: euthymic mood, full affect   Recent Labs: 08/14/2018: B Natriuretic Peptide 2,221.0 08/15/2018: Hemoglobin 11.8; Platelets 218 08/16/2018: BUN 23; Creatinine, Ser 1.21; Potassium 3.7; Sodium 141    Lipid Panel     Wt Readings from Last 3 Encounters:  09/20/18 138 lb (62.6 kg)  09/09/18 143 lb 2 oz (64.9 kg)  08/28/18 143 lb 6.4 oz (65 kg)     ASSESSMENT AND PLAN:  Chronic systolic congestive heart failure (HCC) - Plan: EKG 12-Lead Appears relatively euvolemic Recommend we continue current medications, add potassium 10 mEq daily Suggest we do BMP when she sees the CHF clinic in 3 weeks time  Aortic atherosclerosis ([place]) Ideally should be on  statin She does not want any more medications Would suggest aspirin 81 mg daily  Coronary artery calcification seen on CAT scan Moving well with no anginal symptoms  Essential hypertension Blood pressure improved on current regiment Appears relatively euvolemic  Hypertensive heart and chronic kidney disease with heart failure and stage 1 through stage 4 chronic kidney disease, or chronic kidney disease ([place]) --Consider recheck of basic metabolic panel in 3 weeks time on potassium 10 daily   Total encounter time more than 25 minutes  Greater than 50% was spent in counseling and coordination of care with the patient    Disposition:   F/U  6 months   Orders Placed This Encounter  Procedures  . EKG 12-Lead     Signed, Esmond Plants, M.D., Ph.D. 09/20/2018  [place], [place]

## 2018-09-17 ENCOUNTER — Other Ambulatory Visit: Payer: Self-pay

## 2018-09-17 MED ORDER — LOSARTAN POTASSIUM 100 MG PO TABS: 100.0000 mg | ORAL_TABLET | Freq: Every day | ORAL | 1 refills | Status: DC

## 2018-09-20 ENCOUNTER — Ambulatory Visit (INDEPENDENT_AMBULATORY_CARE_PROVIDER_SITE_OTHER): Payer: Medicare Other | Admitting: Cardiovascular Disease

## 2018-09-20 ENCOUNTER — Encounter: Payer: Self-pay | Admitting: Cardiovascular Disease

## 2018-09-20 VITALS — BP 135/82 | HR 59 | Wt 138.0 lb

## 2018-09-20 DIAGNOSIS — I130 Hypertensive heart and chronic kidney disease with heart failure and stage 1 through stage 4 chronic kidney disease, or unspecified chronic kidney disease: Secondary | ICD-10-CM

## 2018-09-20 DIAGNOSIS — I700 Atherosclerosis of aorta: Secondary | ICD-10-CM | POA: Diagnosis not present

## 2018-09-20 DIAGNOSIS — I5022 Chronic systolic (congestive) heart failure: Secondary | ICD-10-CM

## 2018-09-20 DIAGNOSIS — I248 Other forms of acute ischemic heart disease: Secondary | ICD-10-CM

## 2018-09-20 DIAGNOSIS — I10 Essential (primary) hypertension: Secondary | ICD-10-CM

## 2018-09-20 DIAGNOSIS — I2510 Atherosclerotic heart disease of native coronary artery without angina pectoris: Secondary | ICD-10-CM

## 2018-09-20 MED ORDER — POTASSIUM CHLORIDE ER 10 MEQ PO TBCR: 10.0000 meq | EXTENDED_RELEASE_TABLET | Freq: Every day | ORAL | 3 refills | Status: DC

## 2018-09-20 NOTE — Patient Instructions (Signed)
Medication Instructions:  Please start potassium one pill a day  If you need a refill on your cardiac medications before your next appointment, please call your pharmacy.    Lab work: BMP in 3 weeks when you see CHF clinic   If you have labs (blood work) drawn today and your tests are completely normal, you will receive your results only by: Marland Kitchen MyChart Message (if you have MyChart) OR . A paper copy in the mail If you have any lab test that is abnormal or we need to change your treatment, we will call you to review the results.   Testing/Procedures: No new testing needed   Follow-Up: At [facility], you and your health needs are our priority.  As part of our continuing mission to provide you with exceptional heart care, we have created designated Provider Care Teams.  These Care Teams include your primary Cardiologist (physician) and Advanced Practice Providers (APPs -  Physician Assistants and Nurse Practitioners) who all work together to provide you with the care you need, when you need it.  . You will need a follow up appointment in 6 months .   Please call our office 2 months in advance to schedule this appointment.    . Providers on your designated Care Team:   . Murray Hodgkins, NP . Christell Faith, PA-C . Marrianne Mood, PA-C  Any Other Special Instructions Will Be Listed Below (If Applicable).  For educational health videos Log in to : [email or web address] Or : [email or web address], password : [email or web address]

## 2018-09-23 ENCOUNTER — Telehealth: Payer: Self-pay | Admitting: Internal Medicine

## 2018-09-23 MED ORDER — MELOXICAM 7.5 MG PO TABS: 7.5000 mg | ORAL_TABLET | Freq: Every day | ORAL | 1 refills | Status: DC

## 2018-09-23 NOTE — Telephone Encounter (Signed)
Copied from [place] [phone]. Topic: General - Other >> Sep 23, 2018 12:45 PM Lennox Solders wrote: Reason for CRM:pt will not go to see orthopaedic for her shoulder pain. Pt son Herbie [place] is calling and he is not concern about any bleeding due to meloxicam. Pt son would like dr Derrel Nip to reconsider and give his mother refill on meloxicam. Pt son is concern about her comfort. [facility]. The tylenol is not working

## 2018-09-23 NOTE — Telephone Encounter (Signed)
meloxicam refilled and sent to [facility]

## 2018-09-24 NOTE — Telephone Encounter (Signed)
Pt's son has been informed that medication was sent in to [facility]. Son gave a verbal understanding.

## 2018-10-17 NOTE — Progress Notes (Signed)
Patient ID: Rebekah Ball, female    DOB: 08-Feb-1921, 85 y.o.   MRN: [id number]  HPI  Rebekah Ball is a 85 y/o female with a history of HTN, anemia, previous tobacco use and chronic heart failure.   Echo report from 08/15/18 reviewed and showed an EF of 45-50% along with mild AS/ AR/ MR and mildly elevated PA pressure of 49 mm Hg.   Admitted 08/14/18 due to HF exacerbation. Cardiology consult obtained. Initially needed IV lasix and then transitioned to oral diuretics. Elevated troponin thought to be due to demand ischemia. Discharged after 3 days.   She presents today for a follow-up with a chief complaint of minimal fatigue upon moderate exertion. She says that this has been present for several years. She has associated cough and recent pedal edema along with this. She denies any difficulty sleeping, dizziness, abdominal distention, palpitations, chest pain, shortness of breath or weight gain. Ate at [facility] this weekend and eats at [facility] 1-2 times / week. Has continued to weigh twice daily. Caregiver that is with her brings her home BP log with elevated readings but says that they are using a wrist cuff and have to try taking it 2 or 3 times because patient squirms and hollers that the wrist cuff is hurting her.   Past Medical History:  Diagnosis Date  . Anemia   . Arthritis   . CHF (congestive heart failure) ([place])   . Hypertension   . Macular degeneration    Past Surgical History:  Procedure Laterality Date  . ABDOMINAL HYSTERECTOMY    . ABDOMINAL SURGERY    . BLADDER SURGERY    . CARPAL TUNNEL RELEASE    . CESAREAN SECTION     x4  . COLON SURGERY    . FRACTURE SURGERY    . JOINT REPLACEMENT     right hip, bilateral knees  . TRIGGER FINGER RELEASE     Family History  Problem Relation Age of Onset  . Kidney disease Mother   . CAD Father    Social History   Tobacco Use  . Smoking status: Former Research scientist (life sciences)  . Smokeless tobacco: Never Used  Substance Use Topics  . Alcohol use: Yes     Comment: glass of wine q 3-4 months   Allergies  Allergen Reactions  . Norvasc [Amlodipine Besylate] Swelling and Rash    With ulcerations (per son)  . Morphine And Related Rash   Prior to Admission medications   Medication Sig Start Date End Date Taking? Authorizing Provider  furosemide (LASIX) 20 MG tablet Take 1 tablet (20 mg total) by mouth daily. 08/28/18 08/28/19 Yes Rebekah Mc, MD  isosorbide mononitrate (IMDUR) 30 MG 24 hr tablet Take 1 tablet (30 mg total) by mouth daily. 08/29/18  Yes Rebekah Mc, MD  losartan (COZAAR) 100 MG tablet Take 1 tablet (100 mg total) by mouth daily. 09/17/18  Yes Rebekah Mc, MD  LUMIGAN 0.01 % SOLN Place 1 drop into the left eye at bedtime. 12/19/14  Yes [provider]  meloxicam (MOBIC) 7.5 MG tablet Take 1 tablet (7.5 mg total) by mouth daily. 09/23/18  Yes Rebekah Mc, MD  metoprolol succinate (TOPROL-XL) 25 MG 24 hr tablet Take 1 tablet (25 mg total) by mouth daily. Take with or immediately following a meal. 08/22/18  Yes Rebekah Mc, MD  potassium chloride (K-DUR) 10 MEQ tablet Take 1 tablet (10 mEq total) by mouth daily. 09/20/18  Yes Rebekah Merritts, MD  Review of Systems  Constitutional: Positive for fatigue (minimal). Negative for appetite change.  HENT: Positive for rhinorrhea. Negative for congestion, postnasal drip and sore throat.   Eyes: Negative.   Respiratory: Positive for cough. Negative for shortness of breath.   Cardiovascular: Positive for leg swelling (during the day). Negative for chest pain and palpitations.  Gastrointestinal: Negative for abdominal distention and abdominal pain.  Endocrine: Negative.   Genitourinary: Negative.   Musculoskeletal: Negative for back pain and neck pain.  Skin: Negative.   Allergic/Immunologic: Negative.   Neurological: Negative for dizziness and light-headedness.  Hematological: Negative for adenopathy. Does not bruise/bleed easily.  Psychiatric/Behavioral: Negative  for dysphoric mood and sleep disturbance (sleeping on 1 pillow). The patient is not nervous/anxious.    Vitals:   10/21/18 1020  BP: 140/80  Pulse: (!) 58  Resp: 18  SpO2: 96%  Weight: 141 lb 4 oz (64.1 kg)  Height: 5\' 4"  (1.626 m)   Wt Readings from Last 3 Encounters:  10/21/18 141 lb 4 oz (64.1 kg)  09/20/18 138 lb (62.6 kg)  09/09/18 143 lb 2 oz (64.9 kg)   Lab Results  Component Value Date   CREATININE 1.21 (H) 08/16/2018   CREATININE 1.17 (H) 08/15/2018   CREATININE 1.22 (H) 08/14/2018     Physical Exam Vitals signs and nursing note reviewed.  Constitutional:      Appearance: Normal appearance.  HENT:     Head: Normocephalic and atraumatic.  Neck:     Musculoskeletal: Normal range of motion and neck supple.  Cardiovascular:     Rate and Rhythm: Regular rhythm. Bradycardia present.  Pulmonary:     Effort: Pulmonary effort is normal. No respiratory distress.     Breath sounds: No wheezing or rales.  Abdominal:     General: Abdomen is flat.     Palpations: Abdomen is soft.  Musculoskeletal:     Right lower leg: No edema.     Left lower leg: No edema.  Skin:    General: Skin is warm and dry.  Neurological:     General: No focal deficit present.     Mental Status: She is alert and oriented to person, place, and time.  Psychiatric:        Mood and Affect: Mood normal.        Behavior: Behavior normal.        Thought Content: Thought content normal.    Assessment & Plan:  1: Chronic heart failure with mildly reduced ejection fraction- - NYHA class II - euvolemic today - still weighing twice daily; explained that she only needed to weigh in the mornings and call for an overnight weight gain of >2 pounds or a weekly weight gain of >5 pounds - weight down 2 pounds from last visit here 6 weeks ago - saw cardiology Rebekah Ball) 09/20/2018 - not adding salt and the importance of closely following a 2000mg  sodium diet was reviewed; does eat out at [facility] 1-2 times/  week and ate at [facility] this past weekend - encouraged her to start wearing compression socks daily to help in reducing fluid in her legs; caregiver says they can put them on but patient doesn't want to wear them as they aren't comfortable. "I'm 85 years old and want to be comfortable". She currently walks around the house barefoot and explained that if she wears the compression socks, she should keep her shoes on - could consider increasing her furosemide but would need to watch renal function closely - will get  BMP today per cardiology request - EF >40% so would not qualify for entresto - BNP 08/14/18 was 2221.0 - says that she's received her flu vaccine for this season already - PharmD reconciled medications with the patient  2: HTN- -  BP looks good today - most likely it's elevated at home because they are trying to get a reading 2-3 times because patient is squirming and saying that it hurts - encouraged them to try taking it just once daily and hopefully patient can sit still while it's being checked - saw PCP Derrel Nip) 08/28/2018 - BMP from 08/16/18 reviewed and showed sodium 141, potassium 3.7, creatinine 1.21 and GFR 38  Medication list was reviewed.  Return in 2 months or sooner for any questions/problems before then.

## 2018-10-21 ENCOUNTER — Encounter: Payer: Self-pay | Admitting: Family

## 2018-10-21 ENCOUNTER — Ambulatory Visit: Payer: Medicare Other | Attending: Family | Admitting: Family

## 2018-10-21 VITALS — BP 140/80 | HR 58 | Resp 18 | Ht 64.0 in | Wt 141.2 lb

## 2018-10-21 DIAGNOSIS — I110 Hypertensive heart disease with heart failure: Secondary | ICD-10-CM | POA: Diagnosis not present

## 2018-10-21 DIAGNOSIS — M1990 Unspecified osteoarthritis, unspecified site: Secondary | ICD-10-CM | POA: Insufficient documentation

## 2018-10-21 DIAGNOSIS — I10 Essential (primary) hypertension: Secondary | ICD-10-CM

## 2018-10-21 DIAGNOSIS — Z888 Allergy status to other drugs, medicaments and biological substances status: Secondary | ICD-10-CM | POA: Diagnosis not present

## 2018-10-21 DIAGNOSIS — Z8249 Family history of ischemic heart disease and other diseases of the circulatory system: Secondary | ICD-10-CM | POA: Insufficient documentation

## 2018-10-21 DIAGNOSIS — Z79899 Other long term (current) drug therapy: Secondary | ICD-10-CM | POA: Insufficient documentation

## 2018-10-21 DIAGNOSIS — Z885 Allergy status to narcotic agent status: Secondary | ICD-10-CM | POA: Insufficient documentation

## 2018-10-21 DIAGNOSIS — Z841 Family history of disorders of kidney and ureter: Secondary | ICD-10-CM | POA: Insufficient documentation

## 2018-10-21 DIAGNOSIS — I5022 Chronic systolic (congestive) heart failure: Secondary | ICD-10-CM | POA: Diagnosis not present

## 2018-10-21 DIAGNOSIS — Z791 Long term (current) use of non-steroidal anti-inflammatories (NSAID): Secondary | ICD-10-CM | POA: Insufficient documentation

## 2018-10-21 DIAGNOSIS — H3530 Unspecified macular degeneration: Secondary | ICD-10-CM | POA: Diagnosis not present

## 2018-10-21 DIAGNOSIS — Z87891 Personal history of nicotine dependence: Secondary | ICD-10-CM | POA: Diagnosis not present

## 2018-10-21 LAB — BASIC METABOLIC PANEL
Anion gap: 9 (ref 5–15)
BUN: 24 mg/dL — ABNORMAL HIGH (ref 8–23)
CALCIUM: 8.6 mg/dL — AB (ref 8.9–10.3)
CO2: 25 mmol/L (ref 22–32)
Chloride: 100 mmol/L (ref 98–111)
Creatinine, Ser: 1.52 mg/dL — ABNORMAL HIGH (ref 0.44–1.00)
GFR calc Af Amer: 33 mL/min — ABNORMAL LOW (ref >60)
GFR calc non Af Amer: 29 mL/min — ABNORMAL LOW (ref >60)
Glucose, Bld: 97 mg/dL (ref 70–99)
Potassium: 4.4 mmol/L (ref 3.5–5.1)
SODIUM: 134 mmol/L — AB (ref 135–145)

## 2018-10-21 NOTE — Progress Notes (Signed)
[facility] - PHARMACIST COUNSELING NOTE  ADHERENCE ASSESSMENT  Adherence strategy: Pill pack delivered from pharmacy   Do you ever forget to take your medication? [x] Yes (1) [] No (0)  Do you ever skip doses due to side effects? [] Yes (1) [x] No (0)  Do you have trouble affording your medicines? [] Yes (1) [x] No (0)  Are you ever unable to pick up your medication due to transportation difficulties? [] Yes (1) [x] No (0)  Do you ever stop taking your medications because you don't believe they are helping? [] Yes (1) [x] No (0)  Total score _0______    Recommendations given to patient about increasing adherence: Pt has aid and pill pack seems to be effective strategy for maintenance meds  Guideline-Directed Medical Therapy/Evidence Based Medicine    ACE/ARB/ARNI: Yes   Beta Blocker: Yes   Aldosterone Antagonist: No Diuretic: Yes    SUBJECTIVE   HPI:  Past Medical History:  Diagnosis Date  . Anemia   . Arthritis   . CHF (congestive heart failure) ([place])   . Hypertension   . Macular degeneration         OBJECTIVE    Vital signs: HR 58, BP 140/80, weight (pounds) 141  ECHO: Date 08/15/18, EF 45-50%, notes - The left atrium was moderately dilated.    BMP Latest Ref Rng & Units 08/16/2018 08/15/2018 08/14/2018  Glucose 70 - 99 mg/dL 99 119(H) 158(H)  BUN 8 - 23 mg/dL 23 22 22   Creatinine 0.44 - 1.00 mg/dL 1.21(H) 1.17(H) 1.22(H)  Sodium 135 - 145 mmol/L 141 138 139  Potassium 3.5 - 5.1 mmol/L 3.7 3.9 4.1  Chloride 98 - 111 mmol/L 109 108 110  CO2 22 - 32 mmol/L 24 22 22   Calcium 8.9 - 10.3 mg/dL 8.5(L) 8.6(L) 8.7(L)    ASSESSMENT  Patient is on a lower dose of furosemide, but still having some swelling   PLAN  Will consider increasing furosemide dose after checking labs (if renal function can tolerate)   Time spent: 10 minutes  Lu Duffel, Pharm.D. Clinical Pharmacist 10/21/2018 10:27 AM    Current Outpatient  Medications:  .  furosemide (LASIX) 20 MG tablet, Take 1 tablet (20 mg total) by mouth daily., Disp: 90 tablet, Rfl: 5 .  isosorbide mononitrate (IMDUR) 30 MG 24 hr tablet, Take 1 tablet (30 mg total) by mouth daily., Disp: 30 tablet, Rfl: 6 .  losartan (COZAAR) 100 MG tablet, Take 1 tablet (100 mg total) by mouth daily., Disp: 90 tablet, Rfl: 1 .  LUMIGAN 0.01 % SOLN, Place 1 drop into the left eye at bedtime., Disp: , Rfl:  .  meloxicam (MOBIC) 7.5 MG tablet, Take 1 tablet (7.5 mg total) by mouth daily., Disp: 90 tablet, Rfl: 1 .  metoprolol succinate (TOPROL-XL) 25 MG 24 hr tablet, Take 1 tablet (25 mg total) by mouth daily. Take with or immediately following a meal., Disp: 30 tablet, Rfl: 5 .  potassium chloride (K-DUR) 10 MEQ tablet, Take 1 tablet (10 mEq total) by mouth daily., Disp: 90 tablet, Rfl: 3   COUNSELING POINTS/CLINICAL PEARLS  Metoprolol Succinate (Goal: 200 mg once daily) Warn patient to avoid activities requiring mental alertness or coordination until drug effects are realized, as drug may cause dizziness. Tell patient planning major surgery with anesthesia to alert physician that drug is being used, as drug impairs ability of heart to respond to reflex adrenergic stimuli. Drug may cause diarrhea, fatigue, headache, or depression. Advise diabetic patient to carefully monitor  blood glucose as drug may mask symptoms of hypoglycemia. Patient should take extended-release tablet with or immediately following meals. Counsel patient against sudden discontinuation of drug, as this may precipitate hypertension, angina, or myocardial infarction. In the event of a missed dose, counsel patient to skip the missed dose and maintain a regular dosing schedule.  Losartan (Goal: 150 mg once daily)  Warn female patient to avoid pregnancy and to report a pregnancy that occurs during therapy.  Side effects may include dizziness, upper respiratory infection, nasal congestion, and back pain.   Warn patient to avoid use of potassium supplements or potassium-containing salt substitutes unless they consult healthcare provider.  Furosemide  Drug causes sun-sensitivity. Advise patient to use sunscreen and avoid tanning beds. Patient should avoid activities requiring coordination until drug effects are realized, as drug may cause dizziness, vertigo, or blurred vision. This drug may cause hyperglycemia, hyperuricemia, constipation, diarrhea, loss of appetite, nausea, vomiting, purpuric disorder, cramps, spasticity, asthenia, headache, paresthesia, or scaling eczema. Instruct patient to report unusual bleeding/bruising or signs/symptoms of hypotension, infection, pancreatitis, or ototoxicity (tinnitus, hearing impairment). Advise patient to report signs/symptoms of a severe skin reactions (flu-like symptoms, spreading red rash, or skin/mucous membrane blistering) or erythema multiforme. Instruct patient to eat high-potassium foods during drug therapy, as directed by healthcare professional.  Patient should not drink alcohol while taking this drug.   DRUGS TO AVOID IN HEART FAILURE  Drug or Class Mechanism  Analgesics . NSAIDs . COX-2 inhibitors . Glucocorticoids  Sodium and water retention, increased systemic vascular resistance, decreased response to diuretics   Diabetes Medications . Metformin . Thiazolidinediones o Rosiglitazone (Avandia) o Pioglitazone (Actos) . DPP4 Inhibitors o Saxagliptin (Onglyza) o Sitagliptin (Januvia)   Lactic acidosis Possible calcium channel blockade   Unknown  Antiarrhythmics . Class I  o Flecainide o Disopyramide . Class III o Sotalol . Other o Dronedarone  Negative inotrope, proarrhythmic   Proarrhythmic, beta blockade  Negative inotrope  Antihypertensives . Alpha Blockers o Doxazosin . Calcium Channel Blockers o Diltiazem o Verapamil o Nifedipine . Central Alpha Adrenergics o Moxonidine . Peripheral  Vasodilators o Minoxidil  Increases renin and aldosterone  Negative inotrope    Possible sympathetic withdrawal  Unknown  Anti-infective . Itraconazole . Amphotericin B  Negative inotrope Unknown  Hematologic . Anagrelide . Cilostazol   Possible inhibition of PD IV Inhibition of PD III causing arrhythmias  Neurologic/Psychiatric . Stimulants . Anti-Seizure Drugs o Carbamazepine o Pregabalin . Antidepressants o Tricyclics o Citalopram . Parkinsons o Bromocriptine o Pergolide o Pramipexole . Antipsychotics o Clozapine . Antimigraine o Ergotamine o Methysergide . Appetite suppressants . Bipolar o Lithium  Peripheral alpha and beta agonist activity  Negative inotrope and chronotrope Calcium channel blockade  Negative inotrope, proarrhythmic Dose-dependent QT prolongation  Excessive serotonin activity/valvular damage Excessive serotonin activity/valvular damage Unknown  IgE mediated hypersensitivy, calcium channel blockade  Excessive serotonin activity/valvular damage Excessive serotonin activity/valvular damage Valvular damage  Direct myofibrillar degeneration, adrenergic stimulation  Antimalarials . Chloroquine . Hydroxychloroquine Intracellular inhibition of lysosomal enzymes  Urologic Agents . Alpha Blockers o Doxazosin o Prazosin o Tamsulosin o Terazosin  Increased renin and aldosterone  Adapted from Page RL, et al. "Drugs That May Cause or Exacerbate Heart Failure: A Scientific Statement from the [place]." Circulation 2016; 333:L45-G25. DOI: 10.1161/CIR.0000000000000426   MEDICATION ADHERENCES TIPS AND STRATEGIES 1. Taking medication as prescribed improves patient outcomes in heart failure (reduces hospitalizations, improves symptoms, increases survival) 2. Side effects of medications can be managed by decreasing doses, switching  agents, stopping drugs, or adding additional therapy. Please let someone in the [facility] know if you have having bothersome side effects so we can modify your regimen. Do not alter your medication regimen without talking to [place].  3. Medication reminders can help patients remember to take drugs on time. If you are missing or forgetting doses you can try linking behaviors, using pill boxes, or an electronic reminder like an alarm on your phone or an app. Some people can also get automated phone calls as medication reminders.

## 2018-10-21 NOTE — Patient Instructions (Signed)
Continue weighing daily and call for an overnight weight gain of > 2 pounds or a weekly weight gain of >5 pounds. 

## 2018-10-25 ENCOUNTER — Telehealth: Payer: Self-pay | Admitting: *Deleted

## 2018-10-25 MED ORDER — FUROSEMIDE 20 MG PO TABS: 20.0000 mg | ORAL_TABLET | ORAL | 3 refills | Status: DC

## 2018-10-25 MED ORDER — POTASSIUM CHLORIDE ER 10 MEQ PO TBCR: 10.0000 meq | EXTENDED_RELEASE_TABLET | ORAL | 3 refills | Status: DC

## 2018-10-25 NOTE — Telephone Encounter (Signed)
-----   Message from Minna Merritts, MD sent at 10/24/2018 10:11 PM EDT ----- Regarding: FW: lab follow-up Would decrease the lasix to 20 mg and potassium 10 every other day Increase fluid intake slightly Looks dehydrated Thx TG ----- Message ----- From: Emily Filbert, RN Sent: 10/23/2018   4:15 PM EDT To: Minna Merritts, MD, [facility] Triage Subject: FW: lab follow-up                              Please review BMP dated 10/21/2018- you had requested this be done at the patient's 09/20/18 office visit. [name] drew at her CHF appt.   Thanks! ----- Message ----- From: Alisa Graff, FNP Sent: 10/23/2018   9:10 AM EDT To: Rebeca Alert [facility] Triage Subject: lab follow-up                                  I drew labs on this patient as Dr. Donivan Scull note had asked for them. Can you forward the results to him so that he can take a look at them?  Also let me know if I need to call the patient or if one of you will call the patient.  Thanks!  [facility]

## 2018-10-25 NOTE — Telephone Encounter (Signed)
Called the patient. Spoke with the patient and her cargiver Rebekah Ball. Rebekah Ball is aware of Dr.Gollan recommendation to decrease Lasix 20mg   and Potassium 10 mEq to every other day. Pt is increased fluid intake slightly. [name] rqst that I contact the pt son Rebekah Ball to make him aware of the changes. Spoke with Rebekah Ball. Rebekah Ball verbalized understanding. He rqst that an update Rx be sent to the pt local pharmacy [facility] since they prepare the patient's pill packs. New Rx with updated dosage sent to [facility].

## 2018-11-05 ENCOUNTER — Other Ambulatory Visit: Payer: Self-pay

## 2018-11-05 MED ORDER — LOSARTAN POTASSIUM 100 MG PO TABS: 100.0000 mg | ORAL_TABLET | Freq: Every day | ORAL | 1 refills | Status: DC

## 2018-11-15 ENCOUNTER — Telehealth: Payer: Self-pay

## 2018-11-15 NOTE — Telephone Encounter (Signed)
Fax received for Potassium 10MEQ. Patient states she is having a very hard time swallowing these tablets.  Pharmacy would like to know if it is okay to change medication to Liquid KLC 20MEQ/15ML 7.5ML Daily.

## 2018-11-17 NOTE — Telephone Encounter (Signed)
Ok to change potassium  thx

## 2018-11-18 NOTE — Telephone Encounter (Signed)
Called [facility].  Rebekah Ball a verbal for Liquid Potassium.

## 2018-11-19 NOTE — Telephone Encounter (Signed)
Care Giver phylliss calling to clarify new dose of liquid K+ ( frequency and dilution)

## 2018-11-19 NOTE — Telephone Encounter (Signed)
Spoke with caregiver. She wanted to know what measurement and how much to dilute the potassium. Advised her to call the pharmacist for the dosing amount and dilution instructions. She verbalized understanding and was appreciative.

## 2018-12-05 ENCOUNTER — Telehealth: Payer: Self-pay | Admitting: Cardiovascular Disease

## 2018-12-05 NOTE — Telephone Encounter (Signed)
Called patient's caregiver and advised her to call her PCP regarding ongoing bowel movement issues. Caregiver wanted to make sure bowel treatments would not interfere with cardiac meds or issues. Assured her that PCP could address bowel issues in regards to this as well.

## 2018-12-05 NOTE — Telephone Encounter (Signed)
Patient caregiver Silva Bandy calling States that patient has not had a bowel movement in about a week and sometimes it is even longer than a week and it is usually not much  Would like to discuss with nurse about what to do to help  Please call

## 2018-12-16 ENCOUNTER — Ambulatory Visit: Payer: Self-pay | Admitting: Family

## 2018-12-28 ENCOUNTER — Other Ambulatory Visit: Payer: Self-pay | Admitting: Internal Medicine

## 2019-01-03 ENCOUNTER — Telehealth: Payer: Self-pay | Admitting: Cardiovascular Disease

## 2019-01-03 NOTE — Telephone Encounter (Signed)
Please call regarding Potassium Chloride doseage. Ask for Richardson Landry

## 2019-01-03 NOTE — Telephone Encounter (Signed)
Attempted to call Richardson Landry back at [facility]. I was placed on hold twice  (total of just over 10 minutes).  I hung up- we will try to call back at a later time.

## 2019-01-08 MED ORDER — POTASSIUM CHLORIDE 20 MEQ/15ML (10%) PO SOLN: 10.0000 meq | Freq: Every day | ORAL | 6 refills | Status: DC

## 2019-01-08 NOTE — Telephone Encounter (Signed)
Incoming call from [place], tech from [employer]. Confirming dose of Potassium. Per phone note from Dr. Rockey Situ 11/15/18 give 7.5 ML (10 meq) once daily. Order changed in epic to reflect current dose ordered.   Advised to call for any further questions or concerns.

## 2019-01-22 ENCOUNTER — Other Ambulatory Visit: Payer: Self-pay

## 2019-01-22 ENCOUNTER — Encounter: Payer: Self-pay | Admitting: Family

## 2019-01-22 ENCOUNTER — Ambulatory Visit: Payer: Medicare Other | Attending: Family | Admitting: Family

## 2019-01-22 VITALS — BP 171/83 | HR 58 | Resp 18 | Ht 64.0 in | Wt 135.0 lb

## 2019-01-22 DIAGNOSIS — M1990 Unspecified osteoarthritis, unspecified site: Secondary | ICD-10-CM | POA: Diagnosis not present

## 2019-01-22 DIAGNOSIS — Z87891 Personal history of nicotine dependence: Secondary | ICD-10-CM | POA: Diagnosis not present

## 2019-01-22 DIAGNOSIS — Z841 Family history of disorders of kidney and ureter: Secondary | ICD-10-CM | POA: Insufficient documentation

## 2019-01-22 DIAGNOSIS — Z8249 Family history of ischemic heart disease and other diseases of the circulatory system: Secondary | ICD-10-CM | POA: Diagnosis not present

## 2019-01-22 DIAGNOSIS — Z888 Allergy status to other drugs, medicaments and biological substances status: Secondary | ICD-10-CM | POA: Diagnosis not present

## 2019-01-22 DIAGNOSIS — Z885 Allergy status to narcotic agent status: Secondary | ICD-10-CM | POA: Insufficient documentation

## 2019-01-22 DIAGNOSIS — I5022 Chronic systolic (congestive) heart failure: Secondary | ICD-10-CM | POA: Diagnosis not present

## 2019-01-22 DIAGNOSIS — I110 Hypertensive heart disease with heart failure: Secondary | ICD-10-CM | POA: Diagnosis not present

## 2019-01-22 DIAGNOSIS — Z79899 Other long term (current) drug therapy: Secondary | ICD-10-CM | POA: Insufficient documentation

## 2019-01-22 DIAGNOSIS — I509 Heart failure, unspecified: Secondary | ICD-10-CM | POA: Diagnosis present

## 2019-01-22 DIAGNOSIS — I10 Essential (primary) hypertension: Secondary | ICD-10-CM

## 2019-01-22 NOTE — Patient Instructions (Addendum)
Continue weighing daily and call for an overnight weight gain of > 2 pounds or a weekly weight gain of >5 pounds.   Try to eat more water based fruits (watermelon, oranges etc) or jello or popsicles in addition to some water.

## 2019-01-22 NOTE — Progress Notes (Signed)
Patient ID: Rebekah Ball, female    DOB: 09/03/1920, 85 y.o.   MRN: [id number]  HPI  Ms Rebekah Ball is a 85 y/o female with a history of HTN, anemia, previous tobacco use and chronic heart failure.   Echo report from 08/15/18 reviewed and showed an EF of 45-50% along with mild AS/ AR/ MR and mildly elevated PA pressure of 49 mm Hg.   Admitted 08/14/18 due to HF exacerbation. Cardiology consult obtained. Initially needed IV lasix and then transitioned to oral diuretics. Elevated troponin thought to be due to demand ischemia. Discharged after 3 days.   She presents today for a follow-up visit with a chief complaint of moderate shortness of breath upon minimal exertion. She has associated fatigue, cough and pedal edema along with this. She denies any difficulty sleeping,dizziness, abdominal distention, palpitations, chest pain or weight gain. Has been having issues with her blood pressure because she's not drinking hardly any water. She says that she doesn't like the taste of water and prefers milk, juice and tea.   Past Medical History:  Diagnosis Date  . Anemia   . Arthritis   . CHF (congestive heart failure) ([place])   . Hypertension   . Macular degeneration    Past Surgical History:  Procedure Laterality Date  . ABDOMINAL HYSTERECTOMY    . ABDOMINAL SURGERY    . BLADDER SURGERY    . CARPAL TUNNEL RELEASE    . CESAREAN SECTION     x4  . COLON SURGERY    . FRACTURE SURGERY    . JOINT REPLACEMENT     right hip, bilateral knees  . TRIGGER FINGER RELEASE     Family History  Problem Relation Age of Onset  . Kidney disease Mother   . CAD Father    Social History   Tobacco Use  . Smoking status: Former Research scientist (life sciences)  . Smokeless tobacco: Never Used  Substance Use Topics  . Alcohol use: Yes    Comment: glass of wine q 3-4 months   Allergies  Allergen Reactions  . Rebekah [Amlodipine Besylate] Swelling and Rash    With ulcerations (per son)  . Morphine And Related Rash   Prior to Admission  medications   Medication Sig Start Date End Date Taking? Authorizing Provider  furosemide (LASIX) 20 MG tablet Take 1 tablet (20 mg total) by mouth every other day. 10/25/18 10/25/19 Yes Rebekah Ball  isosorbide mononitrate (IMDUR) 30 MG 24 hr tablet Take 1 tablet (30 mg total) by mouth daily. 08/29/18  Yes Rebekah Mc, Ball  losartan (COZAAR) 100 MG tablet Take 1 tablet (100 mg total) by mouth daily. 11/05/18  Yes Rebekah Mc, Ball  LUMIGAN 0.01 % SOLN Place 1 drop into the left eye at bedtime. 12/19/14  Yes Provider, Historical, Ball  meloxicam (MOBIC) 7.5 MG tablet TAKE ONE TABLET EVERY DAY 12/30/18  Yes Rebekah Mc, Ball  metoprolol succinate (TOPROL-XL) 25 MG 24 hr tablet Take 1 tablet (25 mg total) by mouth daily. Take with or immediately following a meal. 08/22/18  Yes Rebekah Mc, Ball  potassium chloride 20 MEQ/15ML (10%) SOLN Take 7.5 mLs (10 mEq total) by mouth daily. 01/08/19  Yes Rebekah Mu, PA-C    Review of Systems  Constitutional: Positive for fatigue (minimal). Negative for appetite change.  HENT: Positive for rhinorrhea. Negative for congestion, postnasal drip and sore throat.   Eyes: Negative.   Respiratory: Positive for cough and shortness of breath (with minimal  exertion).   Cardiovascular: Positive for leg swelling (during the day). Negative for chest pain and palpitations.  Gastrointestinal: Negative for abdominal distention and abdominal pain.  Endocrine: Negative.   Genitourinary: Negative.   Musculoskeletal: Negative for back pain and neck pain.  Skin: Negative.   Allergic/Immunologic: Negative.   Neurological: Negative for dizziness and light-headedness.  Hematological: Negative for adenopathy. Does not bruise/bleed easily.  Psychiatric/Behavioral: Negative for dysphoric mood and sleep disturbance (sleeping on 1 pillow). The patient is not nervous/anxious.    Vitals:   01/22/19 1124  BP: (!) 171/83  Pulse: (!) 58  Resp: 18  SpO2: 97%  Weight: 135 lb  (61.2 kg)  Height: 5\' 4"  (1.626 m)   Wt Readings from Last 3 Encounters:  01/22/19 135 lb (61.2 kg)  10/21/18 141 lb 4 oz (64.1 kg)  09/20/18 138 lb (62.6 kg)   Lab Results  Component Value Date   CREATININE 1.52 (H) 10/21/2018   CREATININE 1.21 (H) 08/16/2018   CREATININE 1.17 (H) 08/15/2018    Physical Exam Vitals signs and nursing note reviewed.  Constitutional:      Appearance: Normal appearance.  HENT:     Head: Normocephalic and atraumatic.  Neck:     Musculoskeletal: Normal range of motion and neck supple.  Cardiovascular:     Rate and Rhythm: Regular rhythm. Bradycardia present.  Pulmonary:     Effort: Pulmonary effort is normal. No respiratory distress.     Breath sounds: No wheezing or rales.  Abdominal:     General: Abdomen is flat.     Palpations: Abdomen is soft.  Musculoskeletal:     Right lower leg: She exhibits no tenderness. Edema (trace pitting) present.     Left lower leg: She exhibits no tenderness. Edema (trace pitting) present.  Skin:    General: Skin is warm and dry.  Neurological:     General: No focal deficit present.     Mental Status: She is alert and oriented to person, place, and time.  Psychiatric:        Mood and Affect: Mood normal.        Behavior: Behavior normal.        Thought Content: Thought content normal.    Assessment & Plan:  1: Chronic heart failure with mildly reduced ejection fraction- - NYHA class III - euvolemic today -  weighing daily; reminded to call for an overnight weight gain of >2 pounds or a weekly weight gain of >5 pounds - weight down 6 pounds from last visit here 3 months ago - saw cardiology Rebekah Ball) 09/20/2018 - not adding salt and the importance of closely following a 2000mg  sodium diet was reviewed - EF >40% so would not qualify for entresto - BNP 08/14/18 was 2221.0  2: HTN- -  BP elevated today (171/83) and rechecked with manual cuff was 160/70 - facility nurse says that patient is bordering  dehydration because she doesn't drink much water - discussed other ways of getting water in such as watermelon, oranges, jello or popsicles - saw PCP Rebekah Ball) 08/28/2018 - BMP from 10/21/2018 reviewed and showed sodium 134, potassium 4.4, creatinine 1.52 and GFR 29  Medication list was reviewed.  Return in 6 months or sooner for any questions/problems before then.

## 2019-01-30 DIAGNOSIS — H401122 Primary open-angle glaucoma, left eye, moderate stage: Secondary | ICD-10-CM | POA: Diagnosis not present

## 2019-02-27 ENCOUNTER — Ambulatory Visit (INDEPENDENT_AMBULATORY_CARE_PROVIDER_SITE_OTHER): Payer: Medicare Other | Admitting: Internal Medicine

## 2019-02-27 ENCOUNTER — Encounter: Payer: Self-pay | Admitting: Internal Medicine

## 2019-02-27 ENCOUNTER — Other Ambulatory Visit: Payer: Self-pay

## 2019-02-27 DIAGNOSIS — I248 Other forms of acute ischemic heart disease: Secondary | ICD-10-CM

## 2019-02-27 DIAGNOSIS — I10 Essential (primary) hypertension: Secondary | ICD-10-CM

## 2019-02-27 DIAGNOSIS — K5904 Chronic idiopathic constipation: Secondary | ICD-10-CM

## 2019-02-27 DIAGNOSIS — I130 Hypertensive heart and chronic kidney disease with heart failure and stage 1 through stage 4 chronic kidney disease, or unspecified chronic kidney disease: Secondary | ICD-10-CM

## 2019-02-27 NOTE — Progress Notes (Addendum)
Telephone Note  This visit type was conducted due to national recommendations for restrictions regarding the COVID-19 pandemic (e.g. social distancing).  This format is felt to be most appropriate for this patient at this time.  All issues noted in this document were discussed and addressed.  No physical exam was performed (except for noted visual exam findings with Video Visits).   I connected with@ on 02/27/19 at 10:30 AM EDT by  telephone and verified that I am speaking with the correct person using two identifiers. Location patient: home Location provider: work or home office Persons participating in the virtual visit: patient, Dorien Chihuahua her home aide and provider  I discussed the limitations, risks, security and privacy concerns of performing an evaluation and management service by telephone and the availability of in person appointments. I also discussed with the patient that there may be a patient responsible charge related to this service. The patient expressed understanding and agreed to proceed.   Reason for visit: follow up on hypertension  HPI:  85 yr old female with mild compensated systolic heart failure  presentss for follow up.  She is very HOH and most history was provided by her daytime caregiver Dorien Chihuahua  Whose hours have recently been reduced from 60 hours per week to 20 hours per week .  Patient has no complaints today per phone call.  Per caregiver , patient has had infrequent episodes of bilateral pedal edema that have been intermittent and accompanied by mild shortness of breath with exertion.last episode occurred one month ago. She has been less agreeable to drinking adequate water  But will drink other liquids.  She has been walking short distances inside the house using a walker  ,after each meal .  She is no longer constipated, Bowels now moving 2 to 3 times daily,  Formed .  No recent falls      ROS: See pertinent positives and negatives per  HPI.  Past Medical History:  Diagnosis Date  . Anemia   . Arthritis   . CHF (congestive heart failure) ([place])   . Hypertension   . Macular degeneration     Past Surgical History:  Procedure Laterality Date  . ABDOMINAL HYSTERECTOMY    . ABDOMINAL SURGERY    . BLADDER SURGERY    . CARPAL TUNNEL RELEASE    . CESAREAN SECTION     x4  . COLON SURGERY    . FRACTURE SURGERY    . JOINT REPLACEMENT     right hip, bilateral knees  . TRIGGER FINGER RELEASE      Family History  Problem Relation Age of Onset  . Kidney disease Mother   . CAD Father     SOCIAL HX: patient lives semi independently with paid caregivers several hours per day.    Current Outpatient Medications:  .  furosemide (LASIX) 20 MG tablet, Take 1 tablet (20 mg total) by mouth every other day., Disp: 48 tablet, Rfl: 3 .  isosorbide mononitrate (IMDUR) 30 MG 24 hr tablet, Take 1 tablet (30 mg total) by mouth daily., Disp: 30 tablet, Rfl: 6 .  losartan (COZAAR) 100 MG tablet, Take 1 tablet (100 mg total) by mouth daily., Disp: 90 tablet, Rfl: 1 .  LUMIGAN 0.01 % SOLN, Place 1 drop into the left eye at bedtime., Disp: , Rfl:  .  meloxicam (MOBIC) 7.5 MG tablet, TAKE ONE TABLET EVERY DAY, Disp: 90 tablet, Rfl: 1 .  metoprolol succinate (TOPROL-XL) 25 MG 24 hr tablet, Take  1 tablet (25 mg total) by mouth daily. Take with or immediately following a meal., Disp: 30 tablet, Rfl: 5 .  potassium chloride 20 MEQ/15ML (10%) SOLN, Take 7.5 mLs (10 mEq total) by mouth daily., Disp: 225 mL, Rfl: 6  EXAM:   General impression: alert, cooperative and articulate.  No signs of being in distress  Lungs: speech is fluent sentence length suggests that patient is not short of breath and not punctuated by cough, sneezing or sniffing. Marland Kitchen   Psych: affect normal.  speech is articulate and non pressured .  Denies suicidal thoughts   ASSESSMENT AND PLAN:  Discussed the following assessment and plan: Hypertensive heart and chronic kidney  disease with heart failure and stage 1 through stage 4 chronic kidney disease, or chronic kidney disease ([place]) Managed with losartan,  Metoprolol and every other day  use of diuretics   Demand ischemia ([place]) She has had no repeat episodes of chest pain. Continue daily use of Imdur   Chronic idiopathic constipation Resolved.  Advised to use daily stool softener and BFL.      I discussed the assessment and treatment plan with the patient. The patient was provided an opportunity to ask questions and all were answered. The patient agreed with the plan and demonstrated an understanding of the instructions.   The patient was advised to call back or seek an in-person evaluation if the symptoms worsen or if the condition fails to improve as anticipated.  I provided 22 minutes of non-face-to-face time during this encounter.   Crecencio Mc, MD

## 2019-02-28 ENCOUNTER — Telehealth: Payer: Self-pay | Admitting: *Deleted

## 2019-02-28 NOTE — Telephone Encounter (Signed)
Spoke with pt's caregiver to let her know that all she needs to continue checking weekly is the pt's weight and blood pressures. Caregiver is going to call next week with the pt's readings because she hasn't been to the office with the pt since covid-19.

## 2019-02-28 NOTE — Telephone Encounter (Signed)
Copied from [id number]. Topic: General - Inquiry >> Feb 28, 2019  9:35 AM Richardo Priest, NT wrote: Reason for CRM: Ms.Campbell, patient's care giver, is calling in asking if PCP would still like for them to document patients input/output with food, BM and urination, BP readings, and weight. Please advise. Call back is [phone]. States to leave a detailed VM if possible.

## 2019-02-28 NOTE — Telephone Encounter (Signed)
No, just weekly weights and weekly bp readings

## 2019-03-01 ENCOUNTER — Encounter: Payer: Self-pay | Admitting: Internal Medicine

## 2019-03-01 DIAGNOSIS — K5904 Chronic idiopathic constipation: Secondary | ICD-10-CM | POA: Insufficient documentation

## 2019-03-01 NOTE — Assessment & Plan Note (Signed)
She has had no repeat episodes of chest pain. Continue daily use of Imdur

## 2019-03-01 NOTE — Assessment & Plan Note (Signed)
Managed with losartan,  Metoprolol and every other day  use of diuretics

## 2019-03-01 NOTE — Assessment & Plan Note (Signed)
Resolved.  Advised to use daily stool softener and BFL.

## 2019-03-07 NOTE — Telephone Encounter (Signed)
Ms campbell calling with weight and bp reading:  Weight                        BP 7/20    131.8               152/77   Hr 62  7/21    132                  143/69   Hr 52 7/22    132.1               124/67   Hr 59 7/23    133.2               153/74   Hr 37  7/24    133.9               147/79   Hr 50

## 2019-03-11 NOTE — Telephone Encounter (Signed)
Patient has no edema to feet and legs and has no SOB going to hold increasing lasix to 40 will stay on 20 mg Lasix as  Ordered and monitor weight and BP as directed.

## 2019-03-11 NOTE — Telephone Encounter (Signed)
Spoke with pt's caregiver to see how the pt's weight has been since Saturday.   Weight   BP  7/25  132.7            154/71   HR 43 7/26  132.4  145/87   HR 53 7/27  133.8  160/79   HR 50 7/28  133.2  156/94   HR 51   Caregiver stated that baseline weight is around 132lb.   Does pt need to double lasix to 40mg  daily?

## 2019-03-11 NOTE — Telephone Encounter (Signed)
This message sat in your box while you were away  For 4 days/ whey didn't someone send to me ?  I checked my calls daily and never received this message.  Please forward to Rebekah Ball so she can address with staff::  This patient has congestive heart failure and has gained 2 lbs over a 5 day period so she needs to double her lasix dose to 40 mg daily until she is back to baseline.

## 2019-03-11 NOTE — Telephone Encounter (Signed)
If she is not short of breath and caregiver does not see ankle edema ,  She can hold off  on increasing the furosemide and we will use 132 as her baseline weight.

## 2019-04-09 ENCOUNTER — Other Ambulatory Visit: Payer: Self-pay | Admitting: Internal Medicine

## 2019-04-15 ENCOUNTER — Telehealth: Payer: Self-pay | Admitting: Internal Medicine

## 2019-04-15 MED ORDER — FUROSEMIDE 20 MG PO TABS: 20.0000 mg | ORAL_TABLET | Freq: Every day | ORAL | 3 refills | Status: AC

## 2019-04-15 NOTE — Telephone Encounter (Signed)
Called and spoke with Corey Skains, caregiver at [phone].  Corey Skains stated that she does not assist patient with taking medications.  Corey Skains gave number to [facility], another caregiver that assist patient with medication.  [place], at [phone].  No answer.  LMTCB.  Called and spoke to [place], Therapist, sports w/ [facility] and gave MD orders concerning Lasix dosing change per Dr. Derrel Nip.  Aaron Edelman aware and said that he is out-of-the-office already for today but will follow-up tomorrow and will inform caregivers of change.  Spoke with Silva Bandy, caregiver, gave instructions on Lasix.  Caregiver wanted to know if pharmacy will be contacted about medication change since patient receives weekly prepackaged pill packs.

## 2019-04-15 NOTE — Telephone Encounter (Signed)
Called and spoke to [place], caregiver from [place] who was with patient.  Corey Skains stated that patient gained 4 pounds since yesterday.  Yesterday's weight was 133.7 lbs and this morning pt's weight is 137 lbs.  Spoke w/ patient.  Patient denies having any swelling.  No swelling in abdomen, hands, legs or feet.  No chest pain, no other pain, no shortness of breath.  Patient says that she feels great.  Corey Skains took blood pressure while on phone.  Patient's blood pressure was 188/79. HR-61. Patient took bp meds and lasix this morning around 8:30 am.  Patient had just eaten right before bp was taken.  Called patient back and spoke to [place] rechecked bp- 146/82 and hr-55.  No SOB.  No cough.  No other symptoms.  Spoke w/ clinic lead RN who also spoke to lead physician.  Advised patient to call 9-1-1 if she develops symptoms of coughing or SOB.  Patient prefers to call the nurse at [facility] where she stays who will then call 9-1-1 if needed.    Patient voiced understanding.

## 2019-04-15 NOTE — Telephone Encounter (Signed)
Please tell RN to change lasix dosing from every other day to daily for the next 5 days,  And weigh daily to make sure weight is either maintaining or coming down.   Given patient's  Age  and DNR status,  Calling 911 should be a LAST RESORT

## 2019-04-15 NOTE — Telephone Encounter (Signed)
The prescription has been changed to daily dosing [facility]

## 2019-04-15 NOTE — Telephone Encounter (Signed)
Rebekah Ball, pt's caregiver calling in.  States that pt has gained 4 pounds in one day. Current weight is 137.

## 2019-04-16 NOTE — Telephone Encounter (Signed)
Left message for [place], RN to make aware Rx changes have been sent to [facility].  Silva Bandy, caregiver aware also.

## 2019-04-17 ENCOUNTER — Ambulatory Visit: Payer: Self-pay

## 2019-04-17 NOTE — Telephone Encounter (Signed)
Care taker called to get verification of how to to administer the patient Lasix.  Caretaker noticed frequency has changed to everyday .  Verified that. Dosage is now everyday 20mg . Care taker voices understanding.

## 2019-04-17 NOTE — Telephone Encounter (Signed)
  Reason for Disposition . Caller has medication question only, adult not sick, and triager answers question  Answer Assessment - Initial Assessment Questions 1.   NAME of MEDICATION: "What medicine are you calling about?"     Lasix 20 mg 2.   QUESTION: "What is your question?"     Verify dosage 3.   PRESCRIBING HCP: "Who prescribed it?" Reason: if prescribed by specialist, call should be referred to that group.     [place] 4. SYMPTOMS: "Do you have any symptoms  Denies 5. SEVERITY: If symptoms are present, ask "Are they mild, moderate or severe?"     NA 6.  PREGNANCY:  "Is there any chance that you are pregnant?" "When was your last menstrual period?"     Na  Protocols used: MEDICATION QUESTION CALL-A-AH

## 2019-04-22 ENCOUNTER — Emergency Department
Admission: EM | Admit: 2019-04-22 | Discharge: 2019-04-22 | Discharge status: Against Medical Advice | Payer: Medicare Other

## 2019-04-22 ENCOUNTER — Ambulatory Visit: Payer: Self-pay | Admitting: *Deleted

## 2019-04-22 NOTE — ED Notes (Signed)
Someone called to stat registration regarding patients dnr (?son)  Caregiver was at desk to  Listen.  I was asked if we  Needed patient's dnr.  Registration clerk checked media in epic and we do not have a copy and I told them that technically we need to have the copy with the patient.  The caregiver was going to go get it.  Then I noticed patient is no longer in the lobby.

## 2019-04-22 NOTE — Telephone Encounter (Signed)
FYI patient going to ER for stroke like symptoms.

## 2019-04-22 NOTE — Telephone Encounter (Signed)
Caregiver was told patient had possible stroke- other caregivers tried to get patient to go to hospital and she refused- she is agreeing to go now.  Reason for Disposition . [1] Weakness (i.e., paralysis, loss of muscle strength) of the face, arm / hand, or leg / foot on one side of the body AND [2] sudden onset AND [3] present now  Answer Assessment - Initial Assessment Questions 1. SYMPTOM: "What is the main symptom you are concerned about?" (e.g., weakness, numbness)     Weakness and slurred speech, left side of mouth weak 2. ONSET: "When did this start?" (minutes, hours, days; while sleeping)     Sunday night- patient would not go hospital 3. LAST NORMAL: "When was the last time you were normal (no symptoms)?"     Not sure 4. PATTERN "Does this come and go, or has it been constant since it started?"  "Is it present now?"     Constant and present now 5. CARDIAC SYMPTOMS: "Have you had any of the following symptoms: chest pain, difficulty breathing, palpitations?"     no 6. NEUROLOGIC SYMPTOMS: "Have you had any of the following symptoms: headache, dizziness, vision loss, double vision, changes in speech, unsteady on your feet?"     Changes in speech,weakness in R arm 7. OTHER SYMPTOMS: "Do you have any other symptoms?"     no-  8. PREGNANCY: "Is there any chance you are pregnant?" "When was your last menstrual period?"     n/a  Protocols used: NEUROLOGIC DEFICIT-A-AH

## 2019-04-22 NOTE — ED Triage Notes (Signed)
Caregiver says patient here to be checked for stroke.  Right arm weak and speachchange since sat

## 2019-04-23 DIAGNOSIS — Z20828 Contact with and (suspected) exposure to other viral communicable diseases: Secondary | ICD-10-CM | POA: Diagnosis not present

## 2019-05-09 DIAGNOSIS — Z20828 Contact with and (suspected) exposure to other viral communicable diseases: Secondary | ICD-10-CM | POA: Diagnosis not present

## 2019-05-29 DIAGNOSIS — Z23 Encounter for immunization: Secondary | ICD-10-CM | POA: Diagnosis not present

## 2019-06-12 DIAGNOSIS — B029 Zoster without complications: Secondary | ICD-10-CM | POA: Diagnosis not present

## 2019-06-12 DIAGNOSIS — R269 Unspecified abnormalities of gait and mobility: Secondary | ICD-10-CM | POA: Diagnosis not present

## 2019-06-12 DIAGNOSIS — I10 Essential (primary) hypertension: Secondary | ICD-10-CM | POA: Diagnosis not present

## 2019-06-12 DIAGNOSIS — Z79899 Other long term (current) drug therapy: Secondary | ICD-10-CM | POA: Diagnosis not present

## 2019-06-12 DIAGNOSIS — M1990 Unspecified osteoarthritis, unspecified site: Secondary | ICD-10-CM | POA: Diagnosis not present

## 2019-06-12 DIAGNOSIS — F0150 Vascular dementia without behavioral disturbance: Secondary | ICD-10-CM | POA: Diagnosis not present

## 2019-06-12 DIAGNOSIS — N189 Chronic kidney disease, unspecified: Secondary | ICD-10-CM | POA: Diagnosis not present

## 2019-06-12 DIAGNOSIS — I5020 Unspecified systolic (congestive) heart failure: Secondary | ICD-10-CM | POA: Diagnosis not present

## 2019-07-11 DIAGNOSIS — B029 Zoster without complications: Secondary | ICD-10-CM | POA: Diagnosis not present

## 2019-07-11 DIAGNOSIS — B0229 Other postherpetic nervous system involvement: Secondary | ICD-10-CM | POA: Diagnosis not present

## 2019-07-11 DIAGNOSIS — R600 Localized edema: Secondary | ICD-10-CM | POA: Diagnosis not present

## 2019-07-11 DIAGNOSIS — L853 Xerosis cutis: Secondary | ICD-10-CM | POA: Diagnosis not present

## 2019-07-11 DIAGNOSIS — L299 Pruritus, unspecified: Secondary | ICD-10-CM | POA: Diagnosis not present

## 2019-07-23 ENCOUNTER — Ambulatory Visit: Payer: Medicare Other | Admitting: Family

## 2019-09-06 ENCOUNTER — Other Ambulatory Visit: Payer: Self-pay | Admitting: Internal Medicine

## 2019-09-08 ENCOUNTER — Other Ambulatory Visit: Payer: Self-pay | Admitting: Physician Assistant

## 2019-09-08 NOTE — Telephone Encounter (Signed)
Please review for refill. Thanks!  

## 2019-10-22 ENCOUNTER — Other Ambulatory Visit: Payer: Self-pay | Admitting: Internal Medicine

## 2019-10-29 ENCOUNTER — Other Ambulatory Visit: Payer: Self-pay | Admitting: Internal Medicine

## 2019-10-29 NOTE — Telephone Encounter (Signed)
Refilled: 12/30/2018 Last OV: 02/27/2019 Next OV: not scheduled  Last several bmets have been abnormal.

## 2019-11-17 ENCOUNTER — Other Ambulatory Visit: Payer: Self-pay | Admitting: Internal Medicine

## 2019-12-18 DIAGNOSIS — R269 Unspecified abnormalities of gait and mobility: Secondary | ICD-10-CM | POA: Diagnosis not present

## 2019-12-18 DIAGNOSIS — I5020 Unspecified systolic (congestive) heart failure: Secondary | ICD-10-CM | POA: Diagnosis not present

## 2019-12-18 DIAGNOSIS — M1990 Unspecified osteoarthritis, unspecified site: Secondary | ICD-10-CM | POA: Diagnosis not present

## 2019-12-18 DIAGNOSIS — L299 Pruritus, unspecified: Secondary | ICD-10-CM | POA: Diagnosis not present

## 2019-12-18 DIAGNOSIS — R0602 Shortness of breath: Secondary | ICD-10-CM | POA: Diagnosis not present

## 2019-12-18 DIAGNOSIS — L853 Xerosis cutis: Secondary | ICD-10-CM | POA: Diagnosis not present

## 2019-12-18 DIAGNOSIS — R634 Abnormal weight loss: Secondary | ICD-10-CM | POA: Diagnosis not present

## 2019-12-18 DIAGNOSIS — N189 Chronic kidney disease, unspecified: Secondary | ICD-10-CM | POA: Diagnosis not present

## 2019-12-18 DIAGNOSIS — L508 Other urticaria: Secondary | ICD-10-CM | POA: Diagnosis not present

## 2019-12-18 DIAGNOSIS — I10 Essential (primary) hypertension: Secondary | ICD-10-CM | POA: Diagnosis not present

## 2019-12-22 ENCOUNTER — Other Ambulatory Visit: Payer: Self-pay | Admitting: Physician Assistant

## 2019-12-22 NOTE — Telephone Encounter (Signed)
Refill Request.  

## 2020-01-06 ENCOUNTER — Other Ambulatory Visit: Payer: Self-pay | Admitting: Cardiovascular Disease

## 2020-01-06 ENCOUNTER — Other Ambulatory Visit: Payer: Self-pay | Admitting: Internal Medicine

## 2020-01-07 DIAGNOSIS — Z66 Do not resuscitate: Secondary | ICD-10-CM | POA: Diagnosis not present

## 2020-01-07 DIAGNOSIS — I5020 Unspecified systolic (congestive) heart failure: Secondary | ICD-10-CM | POA: Diagnosis not present

## 2020-01-07 DIAGNOSIS — I110 Hypertensive heart disease with heart failure: Secondary | ICD-10-CM | POA: Diagnosis not present

## 2020-01-07 DIAGNOSIS — N189 Chronic kidney disease, unspecified: Secondary | ICD-10-CM | POA: Diagnosis not present

## 2020-01-07 DIAGNOSIS — F0150 Vascular dementia without behavioral disturbance: Secondary | ICD-10-CM | POA: Diagnosis not present

## 2020-01-07 DIAGNOSIS — R269 Unspecified abnormalities of gait and mobility: Secondary | ICD-10-CM | POA: Diagnosis not present

## 2020-01-07 DIAGNOSIS — M1990 Unspecified osteoarthritis, unspecified site: Secondary | ICD-10-CM | POA: Diagnosis not present

## 2020-01-09 DIAGNOSIS — M1990 Unspecified osteoarthritis, unspecified site: Secondary | ICD-10-CM | POA: Diagnosis not present

## 2020-01-09 DIAGNOSIS — F0150 Vascular dementia without behavioral disturbance: Secondary | ICD-10-CM | POA: Diagnosis not present

## 2020-01-09 DIAGNOSIS — R269 Unspecified abnormalities of gait and mobility: Secondary | ICD-10-CM | POA: Diagnosis not present

## 2020-01-09 DIAGNOSIS — I110 Hypertensive heart disease with heart failure: Secondary | ICD-10-CM | POA: Diagnosis not present

## 2020-01-09 DIAGNOSIS — N189 Chronic kidney disease, unspecified: Secondary | ICD-10-CM | POA: Diagnosis not present

## 2020-01-09 DIAGNOSIS — I5020 Unspecified systolic (congestive) heart failure: Secondary | ICD-10-CM | POA: Diagnosis not present

## 2020-01-14 DIAGNOSIS — K5903 Drug induced constipation: Secondary | ICD-10-CM | POA: Diagnosis not present

## 2020-01-14 DIAGNOSIS — I5020 Unspecified systolic (congestive) heart failure: Secondary | ICD-10-CM | POA: Diagnosis not present

## 2020-01-19 ENCOUNTER — Other Ambulatory Visit: Payer: Self-pay | Admitting: Cardiovascular Disease

## 2020-02-12 DIAGNOSIS — M1990 Unspecified osteoarthritis, unspecified site: Secondary | ICD-10-CM | POA: Diagnosis not present

## 2020-02-12 DIAGNOSIS — F0150 Vascular dementia without behavioral disturbance: Secondary | ICD-10-CM | POA: Diagnosis not present

## 2020-02-12 DIAGNOSIS — N189 Chronic kidney disease, unspecified: Secondary | ICD-10-CM | POA: Diagnosis not present

## 2020-02-12 DIAGNOSIS — R269 Unspecified abnormalities of gait and mobility: Secondary | ICD-10-CM | POA: Diagnosis not present

## 2020-02-12 DIAGNOSIS — I110 Hypertensive heart disease with heart failure: Secondary | ICD-10-CM | POA: Diagnosis not present

## 2020-02-12 DIAGNOSIS — I5020 Unspecified systolic (congestive) heart failure: Secondary | ICD-10-CM | POA: Diagnosis not present

## 2020-02-12 DIAGNOSIS — Z66 Do not resuscitate: Secondary | ICD-10-CM | POA: Diagnosis not present

## 2020-02-18 DIAGNOSIS — R269 Unspecified abnormalities of gait and mobility: Secondary | ICD-10-CM | POA: Diagnosis not present

## 2020-02-18 DIAGNOSIS — I5020 Unspecified systolic (congestive) heart failure: Secondary | ICD-10-CM | POA: Diagnosis not present

## 2020-02-18 DIAGNOSIS — N189 Chronic kidney disease, unspecified: Secondary | ICD-10-CM | POA: Diagnosis not present

## 2020-02-18 DIAGNOSIS — I110 Hypertensive heart disease with heart failure: Secondary | ICD-10-CM | POA: Diagnosis not present

## 2020-02-18 DIAGNOSIS — F0150 Vascular dementia without behavioral disturbance: Secondary | ICD-10-CM | POA: Diagnosis not present

## 2020-02-18 DIAGNOSIS — M1990 Unspecified osteoarthritis, unspecified site: Secondary | ICD-10-CM | POA: Diagnosis not present

## 2020-02-24 ENCOUNTER — Other Ambulatory Visit: Payer: Self-pay | Admitting: Internal Medicine

## 2020-02-25 DIAGNOSIS — I110 Hypertensive heart disease with heart failure: Secondary | ICD-10-CM | POA: Diagnosis not present

## 2020-02-25 DIAGNOSIS — R269 Unspecified abnormalities of gait and mobility: Secondary | ICD-10-CM | POA: Diagnosis not present

## 2020-02-25 DIAGNOSIS — F0150 Vascular dementia without behavioral disturbance: Secondary | ICD-10-CM | POA: Diagnosis not present

## 2020-02-25 DIAGNOSIS — N189 Chronic kidney disease, unspecified: Secondary | ICD-10-CM | POA: Diagnosis not present

## 2020-02-25 DIAGNOSIS — M1990 Unspecified osteoarthritis, unspecified site: Secondary | ICD-10-CM | POA: Diagnosis not present

## 2020-02-25 DIAGNOSIS — I5020 Unspecified systolic (congestive) heart failure: Secondary | ICD-10-CM | POA: Diagnosis not present

## 2020-03-03 DIAGNOSIS — R269 Unspecified abnormalities of gait and mobility: Secondary | ICD-10-CM | POA: Diagnosis not present

## 2020-03-03 DIAGNOSIS — I5020 Unspecified systolic (congestive) heart failure: Secondary | ICD-10-CM | POA: Diagnosis not present

## 2020-03-03 DIAGNOSIS — N189 Chronic kidney disease, unspecified: Secondary | ICD-10-CM | POA: Diagnosis not present

## 2020-03-03 DIAGNOSIS — I110 Hypertensive heart disease with heart failure: Secondary | ICD-10-CM | POA: Diagnosis not present

## 2020-03-03 DIAGNOSIS — M1990 Unspecified osteoarthritis, unspecified site: Secondary | ICD-10-CM | POA: Diagnosis not present

## 2020-03-03 DIAGNOSIS — F0150 Vascular dementia without behavioral disturbance: Secondary | ICD-10-CM | POA: Diagnosis not present

## 2020-03-07 DIAGNOSIS — N189 Chronic kidney disease, unspecified: Secondary | ICD-10-CM | POA: Diagnosis not present

## 2020-03-07 DIAGNOSIS — R269 Unspecified abnormalities of gait and mobility: Secondary | ICD-10-CM | POA: Diagnosis not present

## 2020-03-07 DIAGNOSIS — I5020 Unspecified systolic (congestive) heart failure: Secondary | ICD-10-CM | POA: Diagnosis not present

## 2020-03-07 DIAGNOSIS — I110 Hypertensive heart disease with heart failure: Secondary | ICD-10-CM | POA: Diagnosis not present

## 2020-03-07 DIAGNOSIS — F0150 Vascular dementia without behavioral disturbance: Secondary | ICD-10-CM | POA: Diagnosis not present

## 2020-03-07 DIAGNOSIS — M1990 Unspecified osteoarthritis, unspecified site: Secondary | ICD-10-CM | POA: Diagnosis not present

## 2020-03-08 DIAGNOSIS — M1990 Unspecified osteoarthritis, unspecified site: Secondary | ICD-10-CM | POA: Diagnosis not present

## 2020-03-08 DIAGNOSIS — I5020 Unspecified systolic (congestive) heart failure: Secondary | ICD-10-CM | POA: Diagnosis not present

## 2020-03-08 DIAGNOSIS — N189 Chronic kidney disease, unspecified: Secondary | ICD-10-CM | POA: Diagnosis not present

## 2020-03-08 DIAGNOSIS — I110 Hypertensive heart disease with heart failure: Secondary | ICD-10-CM | POA: Diagnosis not present

## 2020-03-08 DIAGNOSIS — F0150 Vascular dementia without behavioral disturbance: Secondary | ICD-10-CM | POA: Diagnosis not present

## 2020-03-08 DIAGNOSIS — R269 Unspecified abnormalities of gait and mobility: Secondary | ICD-10-CM | POA: Diagnosis not present

## 2020-03-10 DIAGNOSIS — R269 Unspecified abnormalities of gait and mobility: Secondary | ICD-10-CM | POA: Diagnosis not present

## 2020-03-10 DIAGNOSIS — I5020 Unspecified systolic (congestive) heart failure: Secondary | ICD-10-CM | POA: Diagnosis not present

## 2020-03-10 DIAGNOSIS — F0150 Vascular dementia without behavioral disturbance: Secondary | ICD-10-CM | POA: Diagnosis not present

## 2020-03-10 DIAGNOSIS — M1990 Unspecified osteoarthritis, unspecified site: Secondary | ICD-10-CM | POA: Diagnosis not present

## 2020-03-10 DIAGNOSIS — I110 Hypertensive heart disease with heart failure: Secondary | ICD-10-CM | POA: Diagnosis not present

## 2020-03-10 DIAGNOSIS — N189 Chronic kidney disease, unspecified: Secondary | ICD-10-CM | POA: Diagnosis not present

## 2020-03-14 DIAGNOSIS — I5020 Unspecified systolic (congestive) heart failure: Secondary | ICD-10-CM | POA: Diagnosis not present

## 2020-03-14 DIAGNOSIS — Z66 Do not resuscitate: Secondary | ICD-10-CM | POA: Diagnosis not present

## 2020-03-14 DIAGNOSIS — F0150 Vascular dementia without behavioral disturbance: Secondary | ICD-10-CM | POA: Diagnosis not present

## 2020-03-14 DIAGNOSIS — N189 Chronic kidney disease, unspecified: Secondary | ICD-10-CM | POA: Diagnosis not present

## 2020-03-14 DIAGNOSIS — I110 Hypertensive heart disease with heart failure: Secondary | ICD-10-CM | POA: Diagnosis not present

## 2020-03-14 DIAGNOSIS — R269 Unspecified abnormalities of gait and mobility: Secondary | ICD-10-CM | POA: Diagnosis not present

## 2020-03-14 DIAGNOSIS — M1990 Unspecified osteoarthritis, unspecified site: Secondary | ICD-10-CM | POA: Diagnosis not present

## 2020-03-17 DIAGNOSIS — N189 Chronic kidney disease, unspecified: Secondary | ICD-10-CM | POA: Diagnosis not present

## 2020-03-17 DIAGNOSIS — I5020 Unspecified systolic (congestive) heart failure: Secondary | ICD-10-CM | POA: Diagnosis not present

## 2020-03-17 DIAGNOSIS — F0150 Vascular dementia without behavioral disturbance: Secondary | ICD-10-CM | POA: Diagnosis not present

## 2020-03-17 DIAGNOSIS — R269 Unspecified abnormalities of gait and mobility: Secondary | ICD-10-CM | POA: Diagnosis not present

## 2020-03-17 DIAGNOSIS — M1990 Unspecified osteoarthritis, unspecified site: Secondary | ICD-10-CM | POA: Diagnosis not present

## 2020-03-17 DIAGNOSIS — I110 Hypertensive heart disease with heart failure: Secondary | ICD-10-CM | POA: Diagnosis not present

## 2020-03-19 DIAGNOSIS — N189 Chronic kidney disease, unspecified: Secondary | ICD-10-CM | POA: Diagnosis not present

## 2020-03-19 DIAGNOSIS — R269 Unspecified abnormalities of gait and mobility: Secondary | ICD-10-CM | POA: Diagnosis not present

## 2020-03-19 DIAGNOSIS — M1990 Unspecified osteoarthritis, unspecified site: Secondary | ICD-10-CM | POA: Diagnosis not present

## 2020-03-19 DIAGNOSIS — I5020 Unspecified systolic (congestive) heart failure: Secondary | ICD-10-CM | POA: Diagnosis not present

## 2020-03-19 DIAGNOSIS — F0150 Vascular dementia without behavioral disturbance: Secondary | ICD-10-CM | POA: Diagnosis not present

## 2020-03-19 DIAGNOSIS — I110 Hypertensive heart disease with heart failure: Secondary | ICD-10-CM | POA: Diagnosis not present

## 2020-03-22 DIAGNOSIS — M1990 Unspecified osteoarthritis, unspecified site: Secondary | ICD-10-CM | POA: Diagnosis not present

## 2020-03-22 DIAGNOSIS — N189 Chronic kidney disease, unspecified: Secondary | ICD-10-CM | POA: Diagnosis not present

## 2020-03-22 DIAGNOSIS — I5020 Unspecified systolic (congestive) heart failure: Secondary | ICD-10-CM | POA: Diagnosis not present

## 2020-03-22 DIAGNOSIS — F0150 Vascular dementia without behavioral disturbance: Secondary | ICD-10-CM | POA: Diagnosis not present

## 2020-03-22 DIAGNOSIS — I110 Hypertensive heart disease with heart failure: Secondary | ICD-10-CM | POA: Diagnosis not present

## 2020-03-22 DIAGNOSIS — R269 Unspecified abnormalities of gait and mobility: Secondary | ICD-10-CM | POA: Diagnosis not present

## 2020-03-24 DIAGNOSIS — M1990 Unspecified osteoarthritis, unspecified site: Secondary | ICD-10-CM | POA: Diagnosis not present

## 2020-03-24 DIAGNOSIS — I110 Hypertensive heart disease with heart failure: Secondary | ICD-10-CM | POA: Diagnosis not present

## 2020-03-24 DIAGNOSIS — F0150 Vascular dementia without behavioral disturbance: Secondary | ICD-10-CM | POA: Diagnosis not present

## 2020-03-24 DIAGNOSIS — N189 Chronic kidney disease, unspecified: Secondary | ICD-10-CM | POA: Diagnosis not present

## 2020-03-24 DIAGNOSIS — R269 Unspecified abnormalities of gait and mobility: Secondary | ICD-10-CM | POA: Diagnosis not present

## 2020-03-24 DIAGNOSIS — I5020 Unspecified systolic (congestive) heart failure: Secondary | ICD-10-CM | POA: Diagnosis not present

## 2020-03-25 DIAGNOSIS — I110 Hypertensive heart disease with heart failure: Secondary | ICD-10-CM | POA: Diagnosis not present

## 2020-03-25 DIAGNOSIS — I5020 Unspecified systolic (congestive) heart failure: Secondary | ICD-10-CM | POA: Diagnosis not present

## 2020-03-25 DIAGNOSIS — M1990 Unspecified osteoarthritis, unspecified site: Secondary | ICD-10-CM | POA: Diagnosis not present

## 2020-03-25 DIAGNOSIS — N189 Chronic kidney disease, unspecified: Secondary | ICD-10-CM | POA: Diagnosis not present

## 2020-03-25 DIAGNOSIS — R269 Unspecified abnormalities of gait and mobility: Secondary | ICD-10-CM | POA: Diagnosis not present

## 2020-03-25 DIAGNOSIS — F0150 Vascular dementia without behavioral disturbance: Secondary | ICD-10-CM | POA: Diagnosis not present

## 2020-03-26 DIAGNOSIS — M1990 Unspecified osteoarthritis, unspecified site: Secondary | ICD-10-CM | POA: Diagnosis not present

## 2020-03-26 DIAGNOSIS — I110 Hypertensive heart disease with heart failure: Secondary | ICD-10-CM | POA: Diagnosis not present

## 2020-03-26 DIAGNOSIS — N189 Chronic kidney disease, unspecified: Secondary | ICD-10-CM | POA: Diagnosis not present

## 2020-03-26 DIAGNOSIS — F0150 Vascular dementia without behavioral disturbance: Secondary | ICD-10-CM | POA: Diagnosis not present

## 2020-03-26 DIAGNOSIS — I5020 Unspecified systolic (congestive) heart failure: Secondary | ICD-10-CM | POA: Diagnosis not present

## 2020-03-26 DIAGNOSIS — R269 Unspecified abnormalities of gait and mobility: Secondary | ICD-10-CM | POA: Diagnosis not present

## 2020-04-14 DEATH — deceased
